# Patient Record
Sex: Female | Born: 1935 | Race: White | Hispanic: No
Health system: Midwestern US, Community
[De-identification: ages and names within clinical notes are randomized; demographics above are authoritative.]

## PROBLEM LIST (undated history)

## (undated) DIAGNOSIS — R609 Edema, unspecified: Secondary | ICD-10-CM

## (undated) DIAGNOSIS — Z5189 Encounter for other specified aftercare: Secondary | ICD-10-CM

## (undated) DIAGNOSIS — I10 Essential (primary) hypertension: Secondary | ICD-10-CM

## (undated) DIAGNOSIS — M1990 Unspecified osteoarthritis, unspecified site: Secondary | ICD-10-CM

## (undated) DIAGNOSIS — R319 Hematuria, unspecified: Secondary | ICD-10-CM

## (undated) DIAGNOSIS — I6523 Occlusion and stenosis of bilateral carotid arteries: Secondary | ICD-10-CM

## (undated) HISTORY — PX: TONSILLECTOMY: SUR1361

## (undated) HISTORY — PX: HAMMER TOE SURGERY: SHX385

## (undated) HISTORY — PX: CHOLECYSTECTOMY: SHX55

## (undated) HISTORY — PX: HERNIA REPAIR: SHX51

---

## 2006-06-17 ENCOUNTER — Encounter: Admission: RE | Admit: 2006-06-17 | Discharge: 2006-06-17 | Payer: Self-pay | Admitting: Orthopedic Surgery

## 2007-04-09 IMAGING — CR DG MYELOGRAM LUMBAR
3 series · 3 of 3 positions shown · IV contrast (omnipaque)
Comparison: none

CLINICAL DATA: Low back pain radiating to the right hip. 
 LUMBAR MYELOGRAM:
TECHNIQUE: Lumbar puncture was performed from a left-sided approach to the midline at the L5-S1 interlaminar space using a 22 gauge spinal needle.  18 cc of Omnipaque 180 were instilled.
TECHNIQUE: Multidetector CT imaging of the lumbar spine was performed after intrathecal injection of contrast.  Multiplanar CT image reconstructions were also generated.

[view not recorded (1 of 3)]
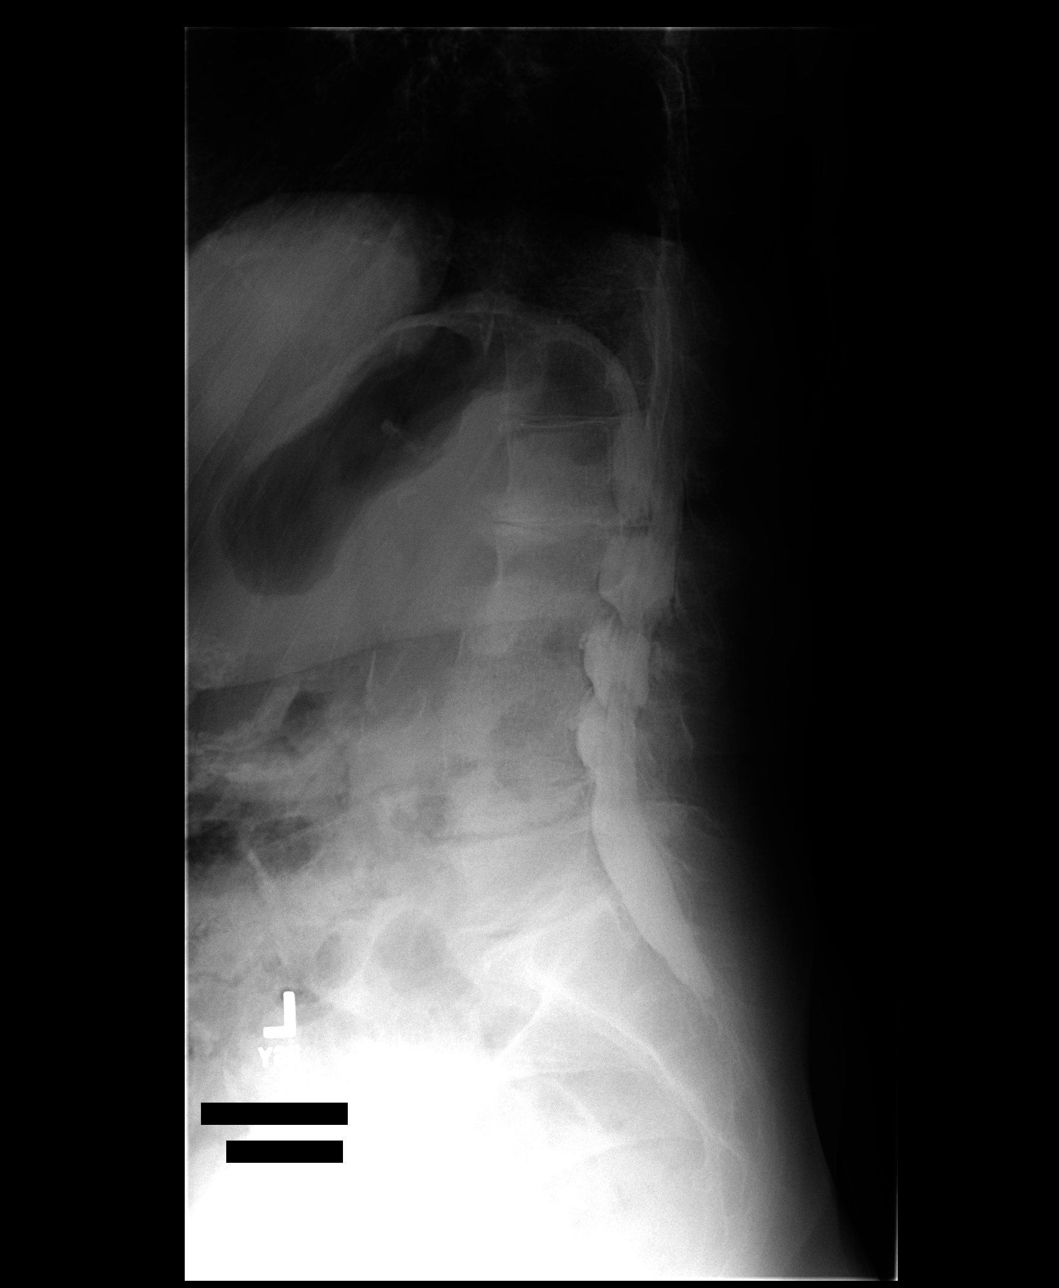

[view not recorded (2 of 3)]
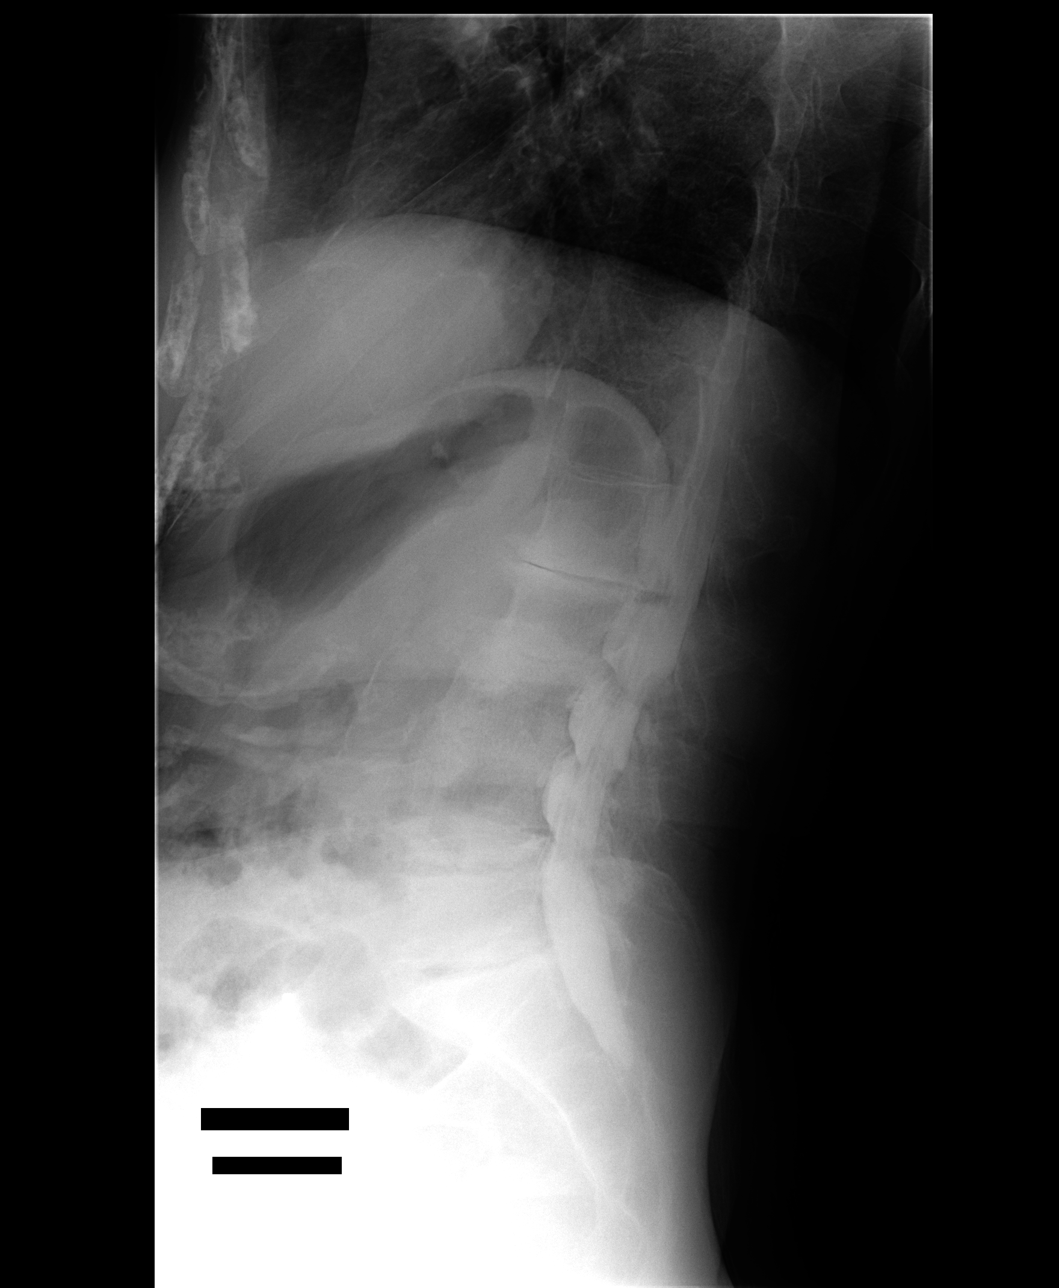

[view not recorded (3 of 3)]
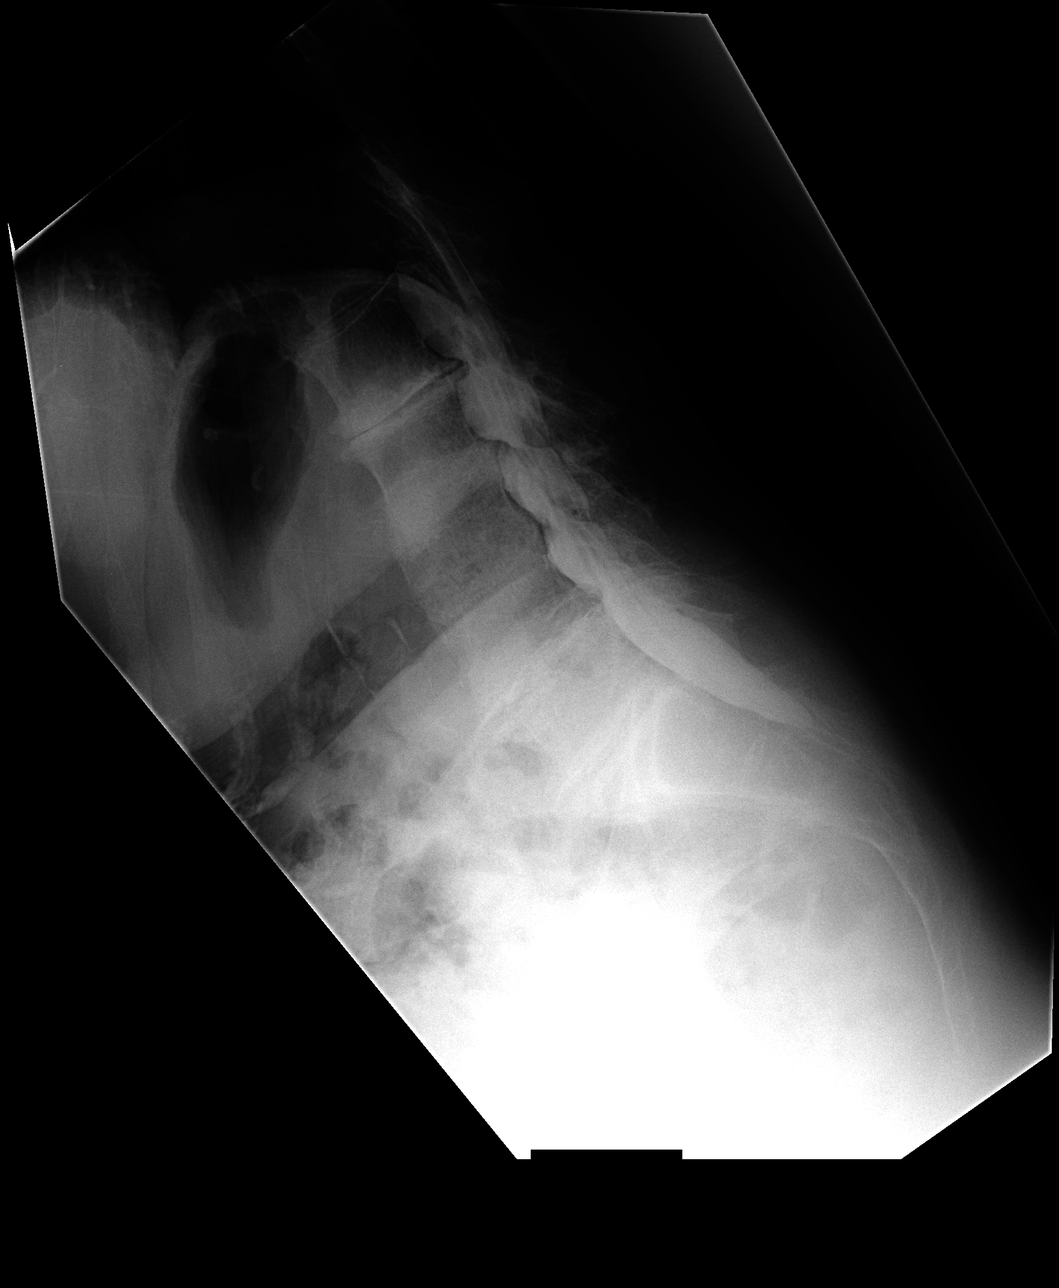

[3 of 3 positions shown; findings below may reference images not displayed]

FINDINGS: There is thoracolumbar scoliosis convex to the right and lower lumbar scoliosis convex to the left.  The discs are degenerated throughout the lumbar region with loss of height more towards the left at T12-L1, L1-2, and L2-3, uniformly at L3-4 and more towards the right at L4-5.  No compressive stenosis is seen at the T12-L1 level.  At L1-2, there is mild narrowing of the left lateral recess.  There is potential for foraminal narrowing on the left. 
 At L2-3, there is an anterior extradural defect with narrowing of the lateral recesses and potential for neural foraminal narrowing on the left. 
 At L3-4, there is an anterior extradural defect with bilateral lateral recess narrowing.
 At L4-5, there is an anterior extradural defect with narrowing of the lateral recesses, more on the right.  There is potential for foraminal narrowing on the right.
 At L5-S1, the disc is degenerated but there is no apparent affect upon the thecal sac or S-1 nerve roots.
 Standing lateral flexion and extension views show the patient does not generate a great degree of bending motion, but no abnormal subluxation occurs.
IMPRESSION: Scoliosis and multilevel degenerative disease.  Lateral recess and foraminal narrowing on the left at L1-2 and L2-3, bilaterally at L3-4 and on the right at L4-5.  See results of CT scan below. 
 CT LUMBAR SPINE WITH CONTRAST (POST-MYELOGRAM):
FINDINGS: L1-2:  The disc is degenerated with loss of height.  There are end plate osteophytes that indent the thecal sac and narrow the lateral recesses mildly.  There is mild foraminal encroachment by osteophytes on the left but neural compression is not suspected at this level.
 At L2-3:  The disc is degenerated with loss of height.  There are end plate osteophytes, more pronounced towards the left.  There is facet hypertrophy of a mild degree bilaterally.  There is narrowing of both lateral recesses and of the intervertebral foramen on the left.  
 L3-4:  The disc is degenerated with loss of height.  There are end plate osteophytes that indent the thecal sac.  There is facet arthropathy, more on the right than the left.  The lateral recesses are mildly narrowed bilaterally.  There is foraminal narrowing on the right that could affect the L-3 nerve root.  In addition, the right-sided facet could be symptomatic in and of itself.
 L4-5:  The disc is markedly degenerated with loss of height.  There are end plate osteophytes more pronounced towards the right.  There is facet arthropathy, right more than left.  There is stenosis of the right lateral recess.  There is foraminal stenosis on the right that could well compress the L-4 nerve root.  The facet on the right that could well compress the L-4 nerve root.  The facet on the right could be symptomatic in and of itself.  
 L5-S1:  The disc is degenerated with loss of height.  There are end plate osteophytes and bulging disc material.  The central canal and S-1 nerve roots are unaffected.  There is neural foraminal narrowing on the right that could irritate the exiting L-5 nerve root though distinct compression is not established.
IMPRESSION: 1.  Scoliosis and multilevel degenerative disease. 
 2.  At L1-2 and L2-3, there is some narrowing of the lateral recesses and of the foramina on the left but no distinct neural compression demonstrated.  No significant right-sided pathology is seen at those levels. 
 3.  At L3-4, there is narrowing of both lateral recesses and some osteophytic encroachment upon both neural foramina.  Although definite neural compression is not established, neural irritation could occur at this level.  Additionally, facet arthropathy on the right could be symptomatic in and of itself.
 4.  At L4-5, the disease is more pronounced on the right with right lateral recess narrowing due to end plate osteophytes, protruding disc material, and marked facet arthropathy on the right.  There is potential for neural compression in the right lateral recess and in the intervertebral foramen on the right.  Additionally, the right-sided facet could be symptomatic in and of itself. 
 5.  At L5-S1, there is degenerative disc disease and degenerative facet disease.  There is mild neural foraminal narrowing on the right but no distinct neural compression.  The facet degeneration on the right could be symptomatic in and of itself.

## 2007-04-09 IMAGING — CT CT L SPINE W/ CM
3 of 9 series · 6 of 20 positions shown, 7 images · IV contrast (omnipaque)
Comparison: none

CLINICAL DATA: Low back pain radiating to the right hip. 
 LUMBAR MYELOGRAM:
TECHNIQUE: Lumbar puncture was performed from a left-sided approach to the midline at the L5-S1 interlaminar space using a 22 gauge spinal needle.  18 cc of Omnipaque 180 were instilled.
TECHNIQUE: Multidetector CT imaging of the lumbar spine was performed after intrathecal injection of contrast.  Multiplanar CT image reconstructions were also generated.

[Series 2: l-spine helical · axial · 0.27mm/px · z∈[-70,-20]mm · 2 of 62 slices shown]
[im 21/62  bone]
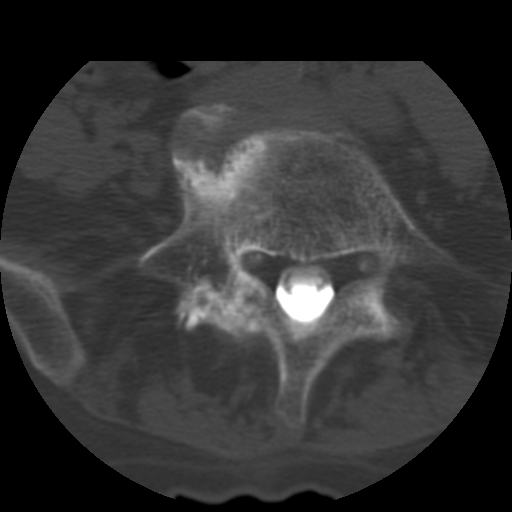
[im 41/62  bone]
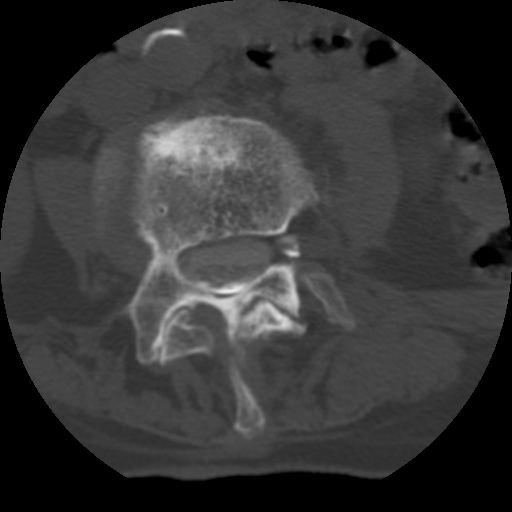

[Series 3: bone windows · axial · 0.27mm/px · z∈[-82,-7]mm · 3 of 62 slices shown, 4 images]
[im 16/62  soft-tissue]
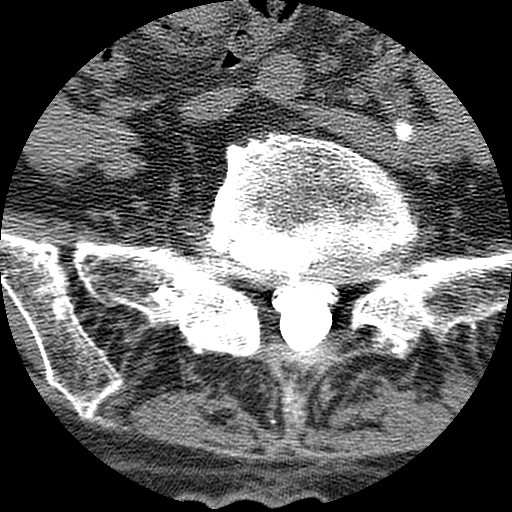
[im 16/62  bone]
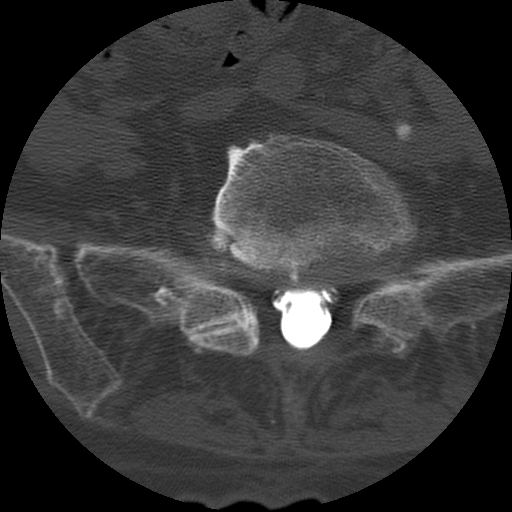
[im 31/62  bone]
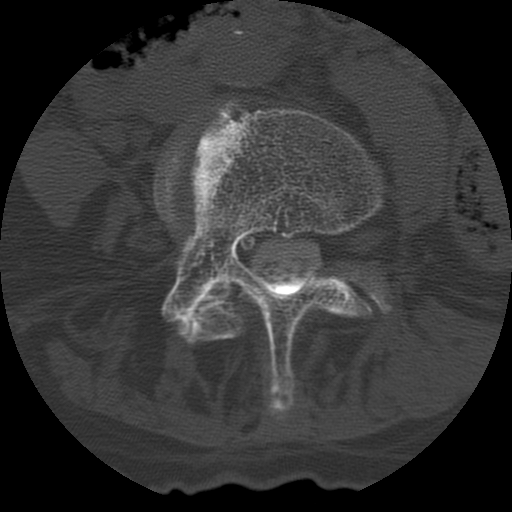
[im 46/62  bone]
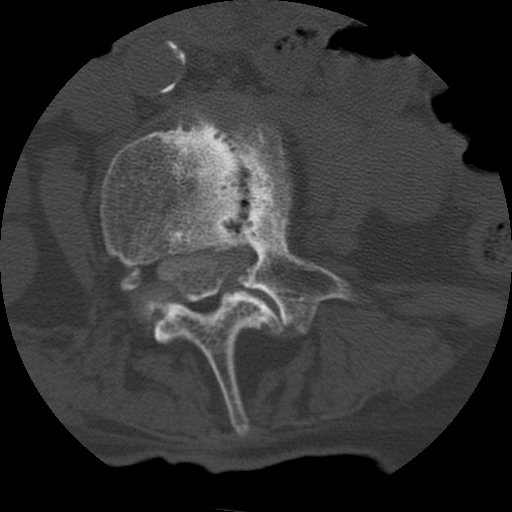

[Series 400: sagittal · sagittal · 0.31mm/px · 1 of 40 slices shown]
[im 20/40  bone]
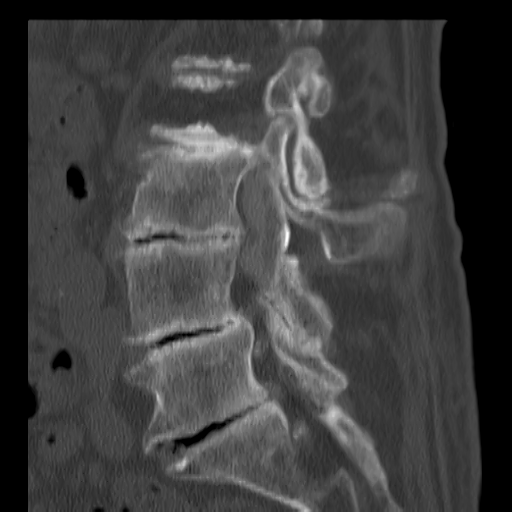

[6 of 20 positions shown; findings below may reference images not displayed]

FINDINGS: There is thoracolumbar scoliosis convex to the right and lower lumbar scoliosis convex to the left.  The discs are degenerated throughout the lumbar region with loss of height more towards the left at T12-L1, L1-2, and L2-3, uniformly at L3-4 and more towards the right at L4-5.  No compressive stenosis is seen at the T12-L1 level.  At L1-2, there is mild narrowing of the left lateral recess.  There is potential for foraminal narrowing on the left. 
 At L2-3, there is an anterior extradural defect with narrowing of the lateral recesses and potential for neural foraminal narrowing on the left. 
 At L3-4, there is an anterior extradural defect with bilateral lateral recess narrowing.
 At L4-5, there is an anterior extradural defect with narrowing of the lateral recesses, more on the right.  There is potential for foraminal narrowing on the right.
 At L5-S1, the disc is degenerated but there is no apparent affect upon the thecal sac or S-1 nerve roots.
 Standing lateral flexion and extension views show the patient does not generate a great degree of bending motion, but no abnormal subluxation occurs.
IMPRESSION: Scoliosis and multilevel degenerative disease.  Lateral recess and foraminal narrowing on the left at L1-2 and L2-3, bilaterally at L3-4 and on the right at L4-5.  See results of CT scan below. 
 CT LUMBAR SPINE WITH CONTRAST (POST-MYELOGRAM):
FINDINGS: L1-2:  The disc is degenerated with loss of height.  There are end plate osteophytes that indent the thecal sac and narrow the lateral recesses mildly.  There is mild foraminal encroachment by osteophytes on the left but neural compression is not suspected at this level.
 At L2-3:  The disc is degenerated with loss of height.  There are end plate osteophytes, more pronounced towards the left.  There is facet hypertrophy of a mild degree bilaterally.  There is narrowing of both lateral recesses and of the intervertebral foramen on the left.  
 L3-4:  The disc is degenerated with loss of height.  There are end plate osteophytes that indent the thecal sac.  There is facet arthropathy, more on the right than the left.  The lateral recesses are mildly narrowed bilaterally.  There is foraminal narrowing on the right that could affect the L-3 nerve root.  In addition, the right-sided facet could be symptomatic in and of itself.
 L4-5:  The disc is markedly degenerated with loss of height.  There are end plate osteophytes more pronounced towards the right.  There is facet arthropathy, right more than left.  There is stenosis of the right lateral recess.  There is foraminal stenosis on the right that could well compress the L-4 nerve root.  The facet on the right that could well compress the L-4 nerve root.  The facet on the right could be symptomatic in and of itself.  
 L5-S1:  The disc is degenerated with loss of height.  There are end plate osteophytes and bulging disc material.  The central canal and S-1 nerve roots are unaffected.  There is neural foraminal narrowing on the right that could irritate the exiting L-5 nerve root though distinct compression is not established.
IMPRESSION: 1.  Scoliosis and multilevel degenerative disease. 
 2.  At L1-2 and L2-3, there is some narrowing of the lateral recesses and of the foramina on the left but no distinct neural compression demonstrated.  No significant right-sided pathology is seen at those levels. 
 3.  At L3-4, there is narrowing of both lateral recesses and some osteophytic encroachment upon both neural foramina.  Although definite neural compression is not established, neural irritation could occur at this level.  Additionally, facet arthropathy on the right could be symptomatic in and of itself.
 4.  At L4-5, the disease is more pronounced on the right with right lateral recess narrowing due to end plate osteophytes, protruding disc material, and marked facet arthropathy on the right.  There is potential for neural compression in the right lateral recess and in the intervertebral foramen on the right.  Additionally, the right-sided facet could be symptomatic in and of itself. 
 5.  At L5-S1, there is degenerative disc disease and degenerative facet disease.  There is mild neural foraminal narrowing on the right but no distinct neural compression.  The facet degeneration on the right could be symptomatic in and of itself.

## 2013-09-26 ENCOUNTER — Emergency Department (HOSPITAL_COMMUNITY)
Admission: EM | Admit: 2013-09-26 | Discharge: 2013-09-26 | Disposition: A | Discharge status: Home / Self Care | Payer: Medicare Other | Attending: Emergency Medicine | Admitting: Emergency Medicine

## 2013-09-26 ENCOUNTER — Encounter (HOSPITAL_COMMUNITY): Payer: Self-pay | Admitting: Emergency Medicine

## 2013-09-26 DIAGNOSIS — M1990 Unspecified osteoarthritis, unspecified site: Secondary | ICD-10-CM | POA: Insufficient documentation

## 2013-09-26 DIAGNOSIS — R197 Diarrhea, unspecified: Secondary | ICD-10-CM | POA: Insufficient documentation

## 2013-09-26 DIAGNOSIS — R630 Anorexia: Secondary | ICD-10-CM | POA: Insufficient documentation

## 2013-09-26 DIAGNOSIS — R110 Nausea: Secondary | ICD-10-CM

## 2013-09-26 DIAGNOSIS — I10 Essential (primary) hypertension: Secondary | ICD-10-CM | POA: Insufficient documentation

## 2013-09-26 DIAGNOSIS — Z79899 Other long term (current) drug therapy: Secondary | ICD-10-CM | POA: Insufficient documentation

## 2013-09-26 DIAGNOSIS — R000 Tachycardia, unspecified: Secondary | ICD-10-CM | POA: Insufficient documentation

## 2013-09-26 DIAGNOSIS — Z87891 Personal history of nicotine dependence: Secondary | ICD-10-CM | POA: Insufficient documentation

## 2013-09-26 HISTORY — DX: Unspecified osteoarthritis, unspecified site: M19.90

## 2013-09-26 HISTORY — DX: Encounter for other specified aftercare: Z51.89

## 2013-09-26 HISTORY — DX: Essential (primary) hypertension: I10

## 2013-09-26 LAB — COMPREHENSIVE METABOLIC PANEL
ALK PHOS: 58 U/L (ref 39–117)
ALT: 11 U/L (ref 0–35)
AST: 19 U/L (ref 0–37)
Albumin: 3.8 g/dL (ref 3.5–5.2)
BUN: 11 mg/dL (ref 6–23)
CO2: 25 meq/L (ref 19–32)
Calcium: 8.5 mg/dL (ref 8.4–10.5)
Chloride: 97 mEq/L (ref 96–112)
Creatinine, Ser: 0.62 mg/dL (ref 0.50–1.10)
GFR, EST NON AFRICAN AMERICAN: 80 mL/min — AB (ref >90)
GLUCOSE: 94 mg/dL (ref 70–99)
POTASSIUM: 3.5 meq/L — AB (ref 3.7–5.3)
SODIUM: 136 meq/L — AB (ref 137–147)
TOTAL PROTEIN: 6.9 g/dL (ref 6.0–8.3)
Total Bilirubin: 0.7 mg/dL (ref 0.3–1.2)

## 2013-09-26 LAB — URINALYSIS, ROUTINE W REFLEX MICROSCOPIC
BILIRUBIN URINE: NEGATIVE
Glucose, UA: NEGATIVE mg/dL
KETONES UR: 15 mg/dL — AB
NITRITE: NEGATIVE
PH: 7 (ref 5.0–8.0)
Protein, ur: NEGATIVE mg/dL
SPECIFIC GRAVITY, URINE: 1.014 (ref 1.005–1.030)
Urobilinogen, UA: 1 mg/dL (ref 0.0–1.0)

## 2013-09-26 LAB — CBC WITH DIFFERENTIAL/PLATELET
Basophils Absolute: 0 10*3/uL (ref 0.0–0.1)
Basophils Relative: 0 % (ref 0–1)
EOS ABS: 0 10*3/uL (ref 0.0–0.7)
Eosinophils Relative: 0 % (ref 0–5)
HCT: 29.8 % — ABNORMAL LOW (ref 36.0–46.0)
Hemoglobin: 10.1 g/dL — ABNORMAL LOW (ref 12.0–15.0)
LYMPHS ABS: 1.2 10*3/uL (ref 0.7–4.0)
LYMPHS PCT: 30 % (ref 12–46)
MCH: 27.8 pg (ref 26.0–34.0)
MCHC: 33.9 g/dL (ref 30.0–36.0)
MCV: 82.1 fL (ref 78.0–100.0)
Monocytes Absolute: 0.3 10*3/uL (ref 0.1–1.0)
Monocytes Relative: 8 % (ref 3–12)
NEUTROS ABS: 2.5 10*3/uL (ref 1.7–7.7)
NEUTROS PCT: 62 % (ref 43–77)
PLATELETS: 78 10*3/uL — AB (ref 150–400)
RBC: 3.63 MIL/uL — AB (ref 3.87–5.11)
RDW: 15.5 % (ref 11.5–15.5)
WBC: 4 10*3/uL (ref 4.0–10.5)

## 2013-09-26 LAB — URINE MICROSCOPIC-ADD ON

## 2013-09-26 MED ORDER — SODIUM CHLORIDE 0.9 % IV BOLUS (SEPSIS)
1000.0000 mL | Freq: Once | INTRAVENOUS | Status: AC
  Administered 2013-09-26: 1000 mL via INTRAVENOUS

## 2013-09-26 NOTE — ED Provider Notes (Signed)
CSN: [id number]     Arrival date & time 09/26/13  1111 History   First MD Initiated Contact with Patient 09/26/13 1504     Chief Complaint  Patient presents with  . Diarrhea  . Nausea     (Consider location/radiation/quality/duration/timing/severity/associated sxs/prior Treatment) HPI Patient presents with concerns of ongoing nausea, diarrhea, anorexia. Symptoms have been present for almost one year. Over this time the patient has had multiple evaluations by multiple physicians.  She has had cholecystectomy. She has had endoscopy. Over the past day his symptoms have been more severe, with increased nausea, both in general and postprandial. She's had multiple episodes of diarrhea, though no vomiting. No bloody stool. No lightheadedness, syncope, chest pain, dyspnea, abdominal pain. When asked why she presents today she replies that she had too much to do yesterday.  Past Medical History  Diagnosis Date  . Arthritis   . Osteoarthritis   . Blood transfusion without reported diagnosis   . Hypertension    Past Surgical History  Procedure Laterality Date  . Cholecystectomy    . Hernia repair    . Hammer toe surgery    . Tonsillectomy     No family history on file. History  Substance Use Topics  . Smoking status: Former Smoker    Quit date: 09/27/1963  . Smokeless tobacco: Not on file  . Alcohol Use: No   OB History   Grav Para Term Preterm Abortions TAB SAB Ect Mult Living                 Review of Systems  Constitutional:       Per HPI, otherwise negative  HENT:       Per HPI, otherwise negative  Respiratory:       Per HPI, otherwise negative  Cardiovascular:       Per HPI, otherwise negative  Gastrointestinal: Positive for nausea and diarrhea. Negative for vomiting and abdominal pain.  Endocrine:       Negative aside from HPI  Genitourinary:       Neg aside from HPI   Musculoskeletal:       Per HPI, otherwise negative  Skin: Negative.   Neurological:  Negative for syncope.      Allergies  Review of patient's allergies indicates no known allergies.  Home Medications   Current Outpatient Rx  Name  Route  Sig  Dispense  Refill  . denosumab (PROLIA) 60 MG/ML SOLN injection   Subcutaneous   Inject 60 mg into the skin every 6 (six) months. Administer in upper arm, thigh, or abdomen         . furosemide (LASIX) 40 MG tablet   Oral   Take 40 mg by mouth daily.         Marland Kitchen. oxyCODONE (ROXICODONE) 15 MG immediate release tablet   Oral   Take 15 mg by mouth every 6 (six) hours as needed for pain.         . polyethylene glycol (MIRALAX / GLYCOLAX) packet   Oral   Take 17 g by mouth daily as needed for mild constipation.         Marland Kitchen. trimethoprim (TRIMPEX) 100 MG tablet   Oral   Take 100 mg by mouth 3 (three) times a week. Monday Wednesday and friday         . verapamil (CALAN-SR) 120 MG CR tablet   Oral   Take 120 mg by mouth daily.         .Marland Kitchen  Vitamin D, Ergocalciferol, (DRISDOL) 50000 UNITS CAPS capsule   Oral   Take 50,000 Units by mouth every 7 (seven) days. Fridays          BP 147/56  Pulse 78  Temp(Src) 98.3 F (36.8 C) (Oral)  Resp 18  SpO2 100% Physical Exam  Nursing note and vitals reviewed. Constitutional: She is oriented to person, place, and time. She appears well-developed. No distress.  Thin elderly F in NAD  HENT:  Head: Normocephalic and atraumatic.  Eyes: Conjunctivae and EOM are normal.  Cardiovascular: Regular rhythm.  Tachycardia present.   Pulmonary/Chest: Effort normal and breath sounds normal. No stridor. No respiratory distress.  Abdominal: Normal appearance and bowel sounds are normal. She exhibits no distension. There is no tenderness. There is no rigidity and no guarding.  Musculoskeletal: She exhibits no edema.  Neurological: She is alert and oriented to person, place, and time. No cranial nerve deficit.  Skin: Skin is warm and dry. She is not diaphoretic.  Psychiatric: She has a  normal mood and affect.    ED Course  Procedures (including critical care time) Labs Review Labs Reviewed  URINALYSIS, ROUTINE W REFLEX MICROSCOPIC - Abnormal; Notable for the following:    Hgb urine dipstick SMALL (*)    Ketones, ur 15 (*)    Leukocytes, UA TRACE (*)    All other components within normal limits  CBC WITH DIFFERENTIAL - Abnormal; Notable for the following:    RBC 3.63 (*)    Hemoglobin 10.1 (*)    HCT 29.8 (*)    Platelets 78 (*)    All other components within normal limits  COMPREHENSIVE METABOLIC PANEL - Abnormal; Notable for the following:    Sodium 136 (*)    Potassium 3.5 (*)    GFR calc non Af Amer 80 (*)    All other components within normal limits  URINE MICROSCOPIC-ADD ON    5:26 PM On repeat exams.  Vital signs normal. She and her husband who are aware of all results and the need followup with gastroenterology as an outpatient.   MDM   Final diagnoses:  Nausea  Diarrhea    Patient presents with concerns of ongoing nausea, anorexia, diarrhea. On exam she is awake, alert and hemodynamically stable aside from mild tachycardia. Patient's labs are largely reassuring.  After 1 L of fluid for resuscitation the patient's vital signs are normalized, and she remained hemodynamically stable, in no distress throughout her emergency department course.     Gerhard Munch, MD 09/27/13 [phone]

## 2013-09-26 NOTE — ED Notes (Addendum)
Pt states she's had N/D since Saturday.  Pt states that her diarrhea is black at times.  Pt states that last June she had the same symptoms and had to have her "bile ducts cleaned out".  [facility]  Pt states she feels the same as she did then.  Pt denies pain currently, but states she has had pain from the diarrhea.

## 2013-09-26 NOTE — Discharge Instructions (Signed)
As discussed, your evaluation today has been largely reassuring.  But, it is important that you monitor your condition carefully, and do not hesitate to return to the ED if you develop new, or concerning changes in your condition.  Otherwise, please follow-up with our gastroenterologist for appropriate ongoing care.    Chronic Diarrhea Diarrhea is frequent loose and watery bowel movements. It can cause you to feel weak and dehydrated. Dehydration can cause you to become tired and thirsty and to have a dry mouth, decreased urination, and dark yellow urine. Diarrhea is a sign of another problem, most often an infection that will not last long. In most cases, diarrhea lasts 2 3 days. Diarrhea that lasts longer than 4 weeks is called long-lasting (chronic) diarrhea. It is important to treat your diarrhea as directed by your health care provider to lessen or prevent future episodes of diarrhea.  CAUSES  There are many causes of chronic diarrhea. The following are some possible causes:   Gastrointestinal infections caused by viruses, bacteria, or parasites.   Food poisoning or food allergies.   Certain medicines, such as antibiotics, chemotherapy, and laxatives.   Artificial sweeteners and fructose.   Digestive disorders, such as celiac disease and inflammatory bowel diseases.   Irritable bowel syndrome.  Some disorders of the pancreas.  Disorders of the thyroid.  Reduced blood flow to the intestines.  Cancer. Sometimes the cause of chronic diarrhea is unknown. RISK FACTORS  Having a severely weakened immune system, such as from HIV or AIDS.   Taking certain types of cancer-fighting drugs (such as with chemotherapy) or other medicines.   Having had a recent organ transplant.   Having a portion of the stomach or small bowel removed.   Traveling to countries where food and water supplies are often contaminated.  SYMPTOMS  In addition to frequent, loose stools, diarrhea  may cause:   Cramping.   Abdominal pain.   Nausea.   Fever.  Fatigue.  Urgent need to use the bathroom.  Loss of bowel control. DIAGNOSIS  Your health care provider must take a careful history and perform a physical exam. Tests given are based on your symptoms and history. Tests may include:   Blood or stool tests. Three or more stool samples may be examined. Stool cultures may be used to test for bacteria or parasites.   X-rays.   A procedure in which a thin tube is inserted into the mouth or rectum (endoscopy). This allows the health care provider to look inside the intestine.  TREATMENT   Treatment is aimed at correcting the cause of the diarrhea when possible.  Diarrhea caused by an infection can often be treated with antibiotics.   Diarrhea not caused by an infection may require you to take long-term medicine or have surgery. Specific treatment should be discussed with your health care provider.   If the cause cannot be determined, treatment aims to relieve symptoms and prevent dehydration. Serious health problems can occur if you do not maintain proper fluid levels. Treatment may include:   Taking an oral rehydration solution (ORS) .  Not drinking beverages that contain caffeine (such as tea, coffee, and soft drinks).   Not drinking alcohol.   Maintaining well-balanced nutrition to help you recover faster. HOME CARE INSTRUCTIONS   Drink enough fluids to keep urine clear or pale yellow. Drink 1 cup (8 oz) of fluid for each diarrhea episode. Avoid fluids that contain simple sugars, fruit juices, whole milk products, and sodas. Hydrate with an  ORS. You may purchase the ORS or prepare it at home by mixing the following ingredients together:     tsp (1.7 3  mL) table salt.    tsp (3  mL) baking soda.    tsp (1.7 mL) salt substitute containing potassium chloride.   1 tbsp (20 mL) sugar.   4.2 c (1 L) of water.   Certain foods and beverages may  increase the speed at which food moves through the gastrointestinal (GI) tract. These foods and beverages should be avoided. They include:   Caffeinated and alcoholic beverages.   High-fiber foods, such as raw fruits and vegetables, nuts, seeds, and whole grain breads and cereals.   Foods and beverages sweetened with sugar alcohols, such as xylitol, sorbitol, and mannitol.   Some foods may be well tolerated and may help thicken stool. These include:  Starchy foods, such as rice, toast, pasta, low-sugar cereal, oatmeal, grits, baked potatoes, crackers, and bagels.   Bananas.   Applesauce.   Add probiotic-rich foods to help increase healthy bacteria in the GI tract. These include yogurt and fermented milk products.   Wash your hands well after each diarrhea episode.   Only take over-the-counter or prescription medicines as directed by your health care provider.   Take a warm bath to relieve any burning or pain from frequent diarrhea episodes. SEEK MEDICAL CARE IF:   You are not urinating as often.  Your urine is a dark color.   You become very tired or dizzy.   You have severe pain in the abdomen or rectum.   Your have blood or pus in your stools.   Your stools look black and tarry.  SEEK IMMEDIATE MEDICAL CARE IF:   You are unable to keep fluids down.   You have persistent vomiting.   You have blood in your stool.  Your stools are black and tarry.   You do not urinate in 6 8 hours, or there is only a small amount of very dark urine.   You have abdominal pain that increases or localizes.   You have weakness, dizziness, confusion, or lightheadedness.   You have a severe headache.   Your diarrhea gets worse or does not get better.   You have a fever or persistent symptoms for more than 2 3 days.   You have a fever and your symptoms suddenly get worse.  MAKE SURE YOU:   Understand these instructions.  Will watch your  condition.  Will get help right away if you are not doing well or get worse. Document Released: 09/18/2003 Document Revised: 02/28/2013 Document Reviewed: 12/21/2012 [facility] Patient Information 2014 [employer] [place].

## 2016-06-17 ENCOUNTER — Ambulatory Visit: Admit: 2016-06-17 | Discharge: 2016-06-17 | Payer: MEDICARE | Attending: Urology | Primary: Family

## 2016-06-17 DIAGNOSIS — N390 Urinary tract infection, site not specified: Secondary | ICD-10-CM

## 2016-06-17 LAB — AMB POC URINALYSIS DIP STICK AUTO W/ MICRO (MICRO RESULTS)
Bilirubin (UA POC): NEGATIVE
Epithelial cells (UA POC): 0
Glucose (UA POC): NEGATIVE
Ketones (UA POC): NEGATIVE
Nitrites (UA POC): NEGATIVE
Protein (UA POC): NEGATIVE
RBCs (UA POC): 0
Specific gravity (UA POC): 1.015 (ref 1.001–1.035)
Urobilinogen (UA POC): 1 (ref 0.2–1)
pH (UA POC): 6.5 (ref 4.6–8.0)

## 2016-06-17 MED ORDER — NITROFURANTOIN MACROCRYSTAL 50 MG CAP
50 mg | ORAL_CAPSULE | Freq: Every evening | ORAL | 3 refills | Status: DC
Start: 2016-06-17 — End: 2018-01-13

## 2016-06-17 NOTE — Progress Notes (Signed)
ASSESSMENT:   Encounter Diagnoses     ICD-10-CM ICD-9-CM   1. Recurrent UTI N39.0 599.0     1. Recurrent UTI - Recurrent UTIs are probably multifactorial and stem from both host and bacterial factors:    - Poor bladder drainage with significant debris in the bladder likely allowing for colonization with bacteria, possibly 2/2 long term Methadone use.    - Atrophy of the vaginal/introital mucosa from lack of estrogenization post-menopausally    - Impaired immunity related to chronic medical conditions and age  We reviewed the AUA guidelines for recurrent UTIs as acquiring 2 culture positive infections in 6 months or 3 infections in 1 year. Stressed the importance of obtaining UCx when she presents with infectious symptoms and to only take abx upon receiving culture results. Discussed recurrent vs persistent UTIs and potential causes, including failure to eradicate bacteria vs reinfection. Will try to 1) treat any current infection 2) prevent future infections and 3) determine any potentially correctable causes for infections (via imaging).    - Cath specimen sent for culture, will call with results.    - Will begin low dose Macrodantin 50mg  QHS, Rx provided. Advised to d/c Trimethoprim.    - Begin Vaginal Estrogen once weekly, application reviewed.    - Discussed Cranberry supplements, literature provided.    - RUS & KUB to further assess upper tracts and r/o possible obstruction and/or stones.    - She may also benefit from estrogen cream to the introitus to improve vaginal tissue health, will discuss at follow up in several months.     2. Body mass index is 15.66 kg/(m^2).    - BMI is within the normal parameters.     RTC next available cysto with RUS & KUB prior; sooner if needed.   All questions answered.         HISTORY OF PRESENT ILLNESS:  Jennifer Holder is a 82 y.o. female who presents today for evaluation of recurrent UTI, accompanied by daughter, Jennifer Holder.    Patient is doing well today, states she has struggled with recurrent UTI's for many years. Previously followed by a Physician, ?urologist, when living in [place], [place]. Patient appears to be a poor historian and is unable to recall any details of care. Does state she has taken Trimethoprim TIW as prophylaxis in the past. UTI associated symptoms include dysuria, frequency, urgency and gross hematuria. Symptoms have resolved in the past with a course of abx. The patient states she has some baseline urinary frequency and urgency, however this is not overly bothersome. Denies passing air in urine stream and no evident fecal matter. Denies flank pain, gross hematuria, dysuria and is asymptomatic for infection today. No f/c/n/v.     No chemical or radiation exposure.   No hx of Breast or Cervical cancer.   Medicated daily with Methadone TID for hx of Scoliosis.   Very brief history of smoking, 1960's.  No personal hx o kidney stones. Denies pneumaturia/fecaluria/hx of diverticulititis. Not usin Steroids. Non diabetic.    FH of Stroke, Mother.      REVIEW OF SYSTEMS:     Constitutional: Fever: No  Skin: Rash: No  HEENT: Hearing difficulty: No  Eyes: Blurred vision: No  Cardiovascular: Chest pain: No  Respiratory: Shortness of breath: No  Gastrointestinal: Nausea/vomiting: No  Musculoskeletal: Back pain: No  Neurological: Weakness: No  Psychological: Memory loss: No  Comments/additional findings:     Past Medical History:   Diagnosis Date   ??? Scoliosis  Past Surgical History:   Procedure Laterality Date   ??? HX CHOLECYSTECTOMY     ??? HX HERNIA REPAIR  2007     History reviewed. No pertinent family history.    Social History   Substance Use Topics   ??? Smoking status: Former Smoker     Years: 10.00   ??? Smokeless tobacco: Never Used   ??? Alcohol use Yes      Comment: coasionally     No Known Allergies    Current Outpatient Prescriptions   Medication Sig Dispense Refill    ??? furosemide (LASIX) 40 mg tablet Take  by mouth daily.     ??? verapamil (CALAN) 120 mg tablet Take 120 mg by mouth three (3) times daily.     ??? methadone (DOLOPHINE) 10 mg tablet Take  by mouth every four (4) hours as needed for Pain.     ??? nitrofurantoin (MACRODANTIN) 50 mg capsule Take 1 Cap by mouth nightly. 30 Cap 3     OB History     No data available          PHYSICAL EXAMINATION:     Visit Vitals   ??? BP 118/60   ??? Ht 5\' 7"  (1.702 m)   ??? Wt 100 lb (45.4 kg)   ??? BMI 15.66 kg/m2     Constitutional: Well developed, well-nourished female in no acute distress.   CV:  No peripheral swelling noted  Respiratory: No respiratory distress or difficulties  Abdomen:  Soft and nontender. No masses.    GU Female 06/17/16:     Vagina: moderate atrophy, significant prolapse   Urethra: small caruncle, no prolapse   Cervix and uterus nl    Rectocele: none     Urethral hypermobility is absent   Negative leak with cough/strain.    Skin: No bruising or rashes.  No petechia.    Neuro/Psych:  Patient with appropriate affect.  Alert and oriented.    Lymphatic:   No enlargement of inguinal lymph nodes.    REVIEW OF LABS AND IMAGING:      Results for orders placed or performed in visit on 06/17/16   AMB POC URINALYSIS DIP STICK AUTO W/ MICRO (MICRO RESULTS)   Result Value Ref Range    Color (UA POC) Yellow     Clarity (UA POC) Clear     Glucose (UA POC) Negative Negative    Bilirubin (UA POC) Negative Negative    Ketones (UA POC) Negative Negative    Specific gravity (UA POC) 1.015 1.001 - 1.035    Blood (UA POC) 1+ Negative    pH (UA POC) 6.5 4.6 - 8.0    Protein (UA POC) Negative Negative    Urobilinogen (UA POC) 1 mg/dL 0.2 - 1    Nitrites (UA POC) Negative Negative    Leukocyte esterase (UA POC) 1+ Negative    Epithelial cells (UA POC) 0     WBCs (UA POC) 0-3     RBCs (UA POC) 0      Bacteria (UA POC)  Negative    Crystals (UA POC)  Negative    Other (UA POC)         Prior labs or imaging:    Ucx    04/12/16: 25,000 - 50,000 cfu/mL Enterobacter aerogenes  01/26/16:  >100,000 CFU/mL Enterobacter cloacae ??  12/11/15: >100,000 CFU/mL Klebsiella pneumoniae     A copy of today's office visit with all pertinent imaging results and labs were sent to the  referring physician.      Janann August, MD  [address]  [place], [place]   [address]  [phone] ext [phone]  [phone] fax    Medical Documentation is provided with the assistance of Vassie Loll, Medical Scribe for Janann August, MD

## 2016-06-21 LAB — URINE C&S

## 2016-07-01 ENCOUNTER — Ambulatory Visit: Admit: 2016-07-01 | Discharge: 2016-07-01 | Payer: MEDICARE | Primary: Family

## 2016-07-01 DIAGNOSIS — N390 Urinary tract infection, site not specified: Secondary | ICD-10-CM

## 2016-07-01 NOTE — Progress Notes (Signed)
Renal ultrasound done per office protocol.

## 2016-07-03 NOTE — Progress Notes (Signed)
Please call to tell her that the renal US suggests that she has stones, so she will need a non contrast CT of abdomen and pelvis. Can you order that study? Thanks.  res

## 2016-07-06 ENCOUNTER — Encounter

## 2016-07-06 NOTE — Telephone Encounter (Signed)
Janann Augustoger E Schultz, MD  [facility] ??   ??    ??    ??   ?? Please call to tell her that the renal US suggests that she has stones, so she will need a non contrast CT of abdomen and pelvis. Can you order that study? Thanks.   res       Called pt and informed, [facility].

## 2016-08-11 ENCOUNTER — Encounter: Attending: Urology | Primary: Family

## 2016-08-11 ENCOUNTER — Ambulatory Visit: Admit: 2016-08-11 | Discharge: 2016-08-12 | Payer: MEDICARE | Primary: Family

## 2016-08-11 DIAGNOSIS — N390 Urinary tract infection, site not specified: Secondary | ICD-10-CM

## 2016-08-11 NOTE — Progress Notes (Signed)
KUB performed today per Dr. Schultz.

## 2016-08-23 NOTE — Progress Notes (Unsigned)
I contacted this patient to try to get her into the office to have the Cysto scheduled, but the patient declined to set an appointment. She says she wants to hold off until all of the results of the testing is back before she schedules. Patient is scheduled to have a Ct of the Abdomin  And Pelvis on Feb 13 at 1:00pm.

## 2016-08-25 ENCOUNTER — Encounter

## 2016-08-30 NOTE — Progress Notes (Signed)
Please call her to review the CT report and tell her that she must contact either her PCP or GI specialist to decide if the biliary dilation needs to be addressed.  Thanks.  res

## 2016-08-31 NOTE — Telephone Encounter (Signed)
Jennifer Augustoger E Schultz, MD  [facility] ??   ??    ??    ??   ?? Please call her to review the CT report and tell her that she must contact either her PCP or GI specialist to decide if the biliary dilation needs to be addressed. ??Thanks.   res       Called pt and informed of results. CT report has been forwarded to PCP. Advised pt to call them and make an appointment, pt agreed and verbalized understanding.  [facility] and informed the results had been routed.

## 2018-01-11 ENCOUNTER — Emergency Department: Admit: 2018-01-11 | Payer: MEDICARE | Primary: Family

## 2018-01-11 ENCOUNTER — Inpatient Hospital Stay
Admit: 2018-01-11 | Discharge: 2018-01-13 | Disposition: A | Discharge status: Home / Self Care | Payer: MEDICARE | Attending: Internal Medicine | Admitting: Internal Medicine

## 2018-01-11 DIAGNOSIS — I214 Non-ST elevation (NSTEMI) myocardial infarction: Secondary | ICD-10-CM

## 2018-01-11 LAB — URINE MICROSCOPIC ONLY
RBC, UA: 4 /hpf (ref 0–5)
RBC: 4 /hpf (ref 0–5)
WBC, UA: 0 /hpf (ref 0–4)
WBC: 0 /hpf (ref 0–4)

## 2018-01-11 LAB — EKG, 12 LEAD, SUBSEQUENT
Atrial Rate: 88 {beats}/min
Calculated P Axis: 35 degrees
Calculated R Axis: -26 degrees
Calculated T Axis: 43 degrees
P-R Interval: 212 ms
Q-T Interval: 464 ms
QRS Duration: 78 ms
QTC Calculation (Bezet): 561 ms
Ventricular Rate: 88 {beats}/min

## 2018-01-11 LAB — URINALYSIS W/ RFLX MICROSCOPIC
Bilirubin, Urine: NEGATIVE
Bilirubin: NEGATIVE
Glucose, Ur: NEGATIVE mg/dL
Glucose: NEGATIVE mg/dL
Ketone: NEGATIVE mg/dL
Ketones, Urine: NEGATIVE mg/dL
Leukocyte Esterase, Urine: NEGATIVE
Leukocyte Esterase: NEGATIVE
Nitrite, Urine: NEGATIVE
Nitrites: NEGATIVE
Protein, UA: NEGATIVE mg/dL
Protein: NEGATIVE mg/dL
Specific Gravity, UA: 1.009 (ref 1.005–1.030)
Specific gravity: 1.009 (ref 1.005–1.030)
Urobilinogen, UA, POCT: 1 EU/dL (ref 0.2–1.0)
Urobilinogen: 1 EU/dL (ref 0.2–1.0)
pH (UA): 8.5 — ABNORMAL HIGH (ref 5.0–8.0)
pH, UA: 8.5 — ABNORMAL HIGH (ref 5.0–8.0)

## 2018-01-11 LAB — HEPATIC FUNCTION PANEL
A-G Ratio: 1.2 (ref 0.8–1.7)
ALT (SGPT): 19 U/L (ref 13–56)
ALT: 19 U/L (ref 13–56)
AST (SGOT): 37 U/L (ref 15–37)
AST: 37 U/L (ref 15–37)
Albumin/Globulin Ratio: 1.2 (ref 0.8–1.7)
Albumin: 4 g/dL (ref 3.4–5.0)
Albumin: 4 g/dL (ref 3.4–5.0)
Alk. phosphatase: 71 U/L (ref 45–117)
Alkaline Phosphatase: 71 U/L (ref 45–117)
Bilirubin, Direct: 0.2 MG/DL (ref 0.0–0.2)
Bilirubin, direct: 0.2 MG/DL (ref 0.0–0.2)
Bilirubin, total: 1 MG/DL (ref 0.2–1.0)
Globulin: 3.4 g/dL (ref 2.0–4.0)
Globulin: 3.4 g/dL (ref 2.0–4.0)
Protein, total: 7.4 g/dL (ref 6.4–8.2)
Total Bilirubin: 1 MG/DL (ref 0.2–1.0)
Total Protein: 7.4 g/dL (ref 6.4–8.2)

## 2018-01-11 LAB — NT-PRO BNP: NT pro-BNP: 2693 PG/ML — ABNORMAL HIGH (ref 0–1800)

## 2018-01-11 LAB — METABOLIC PANEL, BASIC
Anion gap: 5 mmol/L (ref 3.0–18)
BUN/Creatinine ratio: 18 (ref 12–20)
BUN: 17 MG/DL (ref 7.0–18)
CO2: 33 mmol/L — ABNORMAL HIGH (ref 21–32)
Calcium: 9.2 MG/DL (ref 8.5–10.1)
Chloride: 100 mmol/L (ref 100–108)
Creatinine: 0.92 MG/DL (ref 0.6–1.3)
GFR est AA: >60 mL/min/{1.73_m2} (ref >60)
GFR est non-AA: 57 mL/min/{1.73_m2} — ABNORMAL LOW (ref >60)
Glucose: 118 mg/dL — ABNORMAL HIGH (ref 74–99)
Potassium: 3.8 mmol/L (ref 3.5–5.5)
Sodium: 138 mmol/L (ref 136–145)

## 2018-01-11 LAB — CBC WITH AUTOMATED DIFF
ABS. BASOPHILS: 0 10*3/uL (ref 0.0–0.1)
ABS. EOSINOPHILS: 0 10*3/uL (ref 0.0–0.4)
ABS. LYMPHOCYTES: 2.6 10*3/uL (ref 0.8–3.5)
ABS. MONOCYTES: 0.6 10*3/uL (ref 0–1.0)
ABS. NEUTROPHILS: 12.8 10*3/uL — ABNORMAL HIGH (ref 1.8–8.0)
BASOPHILS: 0 % (ref 0–3)
EOSINOPHILS: 0 % (ref 0–5)
HCT: 33.6 % — ABNORMAL LOW (ref 35.0–45.0)
HGB: 11.2 g/dL — ABNORMAL LOW (ref 12.0–16.0)
LYMPHOCYTES: 16 % — ABNORMAL LOW (ref 20–51)
MCH: 28.5 PG (ref 24.0–34.0)
MCHC: 33.3 g/dL (ref 31.0–37.0)
MCV: 85.5 FL (ref 74.0–97.0)
MONOCYTES: 4 % (ref 2–9)
NEUTROPHILS: 80 % — ABNORMAL HIGH (ref 42–75)
PLATELET: 113 10*3/uL — ABNORMAL LOW (ref 135–420)
RBC: 3.93 M/uL — ABNORMAL LOW (ref 4.20–5.30)
RDW: 15.5 % — ABNORMAL HIGH (ref 11.6–14.5)
WBC: 16 10*3/uL — ABNORMAL HIGH (ref 4.6–13.2)

## 2018-01-11 LAB — CARDIAC PANEL,(CK, CKMB & TROPONIN)
CK - MB: 9.4 ng/ml — ABNORMAL HIGH (ref <3.6)
CK-MB Index: 6.9 % — ABNORMAL HIGH (ref 0.0–4.0)
CK: 136 U/L (ref 26–192)
Troponin-I, QT: 3.01 NG/ML — CR (ref 0.0–0.045)

## 2018-01-11 LAB — EKG, 12 LEAD, INITIAL
Atrial Rate: 99 {beats}/min
Calculated P Axis: 65 degrees
Calculated R Axis: -16 degrees
Calculated T Axis: 52 degrees
P-R Interval: 194 ms
Q-T Interval: 408 ms
QRS Duration: 84 ms
QTC Calculation (Bezet): 523 ms
Ventricular Rate: 99 {beats}/min

## 2018-01-11 LAB — POC LACTIC ACID
Lactic Acid (POC): 2.13 mmol/L — CR (ref 0.40–2.00)
Lactic Acid (POC): 1 mmol/L (ref 0.40–2.00)

## 2018-01-11 LAB — TROPONIN I: Troponin-I, QT: 3.04 NG/ML — CR (ref 0.0–0.045)

## 2018-01-11 LAB — MAGNESIUM
Magnesium: 2.6 mg/dL (ref 1.6–2.6)
Magnesium: 2.6 mg/dL (ref 1.6–2.6)

## 2018-01-11 LAB — BASIC METABOLIC PANEL
Anion Gap: 5 mmol/L (ref 3.0–18)
BUN: 17 MG/DL (ref 7.0–18)
Bun/Cre Ratio: 18 (ref 12–20)
CO2: 33 mmol/L — ABNORMAL HIGH (ref 21–32)
Calcium: 9.2 MG/DL (ref 8.5–10.1)
Chloride: 100 mmol/L (ref 100–108)
Creatinine: 0.92 MG/DL (ref 0.6–1.3)
EGFR IF NonAfrican American: 57 mL/min/{1.73_m2} — ABNORMAL LOW (ref >60)
GFR African American: >60 mL/min/{1.73_m2} (ref >60)
Glucose: 118 mg/dL — ABNORMAL HIGH (ref 74–99)
Potassium: 3.8 mmol/L (ref 3.5–5.5)
Sodium: 138 mmol/L (ref 136–145)

## 2018-01-11 LAB — CBC WITH AUTO DIFFERENTIAL
Basophils %: 0 % (ref 0–3)
Basophils Absolute: 0 10*3/uL (ref 0.0–0.1)
Eosinophils %: 0 % (ref 0–5)
Eosinophils Absolute: 0 10*3/uL (ref 0.0–0.4)
Hematocrit: 33.6 % — ABNORMAL LOW (ref 35.0–45.0)
Hemoglobin: 11.2 g/dL — ABNORMAL LOW (ref 12.0–16.0)
Lymphocytes %: 16 % — ABNORMAL LOW (ref 20–51)
Lymphocytes Absolute: 2.6 10*3/uL (ref 0.8–3.5)
MCH: 28.5 PG (ref 24.0–34.0)
MCHC: 33.3 g/dL (ref 31.0–37.0)
MCV: 85.5 FL (ref 74.0–97.0)
Monocytes %: 4 % (ref 2–9)
Monocytes Absolute: 0.6 10*3/uL (ref 0–1.0)
Neutrophils %: 80 % — ABNORMAL HIGH (ref 42–75)
Neutrophils Absolute: 12.8 10*3/uL — ABNORMAL HIGH (ref 1.8–8.0)
Platelets: 113 10*3/uL — ABNORMAL LOW (ref 135–420)
RBC: 3.93 M/uL — ABNORMAL LOW (ref 4.20–5.30)
RDW: 15.5 % — ABNORMAL HIGH (ref 11.6–14.5)
WBC: 16 10*3/uL — ABNORMAL HIGH (ref 4.6–13.2)

## 2018-01-11 LAB — EKG 12-LEAD
Atrial Rate: 88 {beats}/min
Atrial Rate: 99 {beats}/min
P Axis: 35 degrees
P Axis: 65 degrees
P-R Interval: 194 ms
P-R Interval: 212 ms
Q-T Interval: 408 ms
Q-T Interval: 464 ms
QRS Duration: 78 ms
QRS Duration: 84 ms
QTc Calculation (Bazett): 523 ms
QTc Calculation (Bazett): 561 ms
R Axis: -16 degrees
R Axis: -26 degrees
T Axis: 43 degrees
T Axis: 52 degrees
Ventricular Rate: 88 {beats}/min
Ventricular Rate: 99 {beats}/min

## 2018-01-11 LAB — CARDIAC PANEL
CK-MB Index: 6.9 % — ABNORMAL HIGH (ref 0.0–4.0)
CK-MB: 9.4 ng/ml — ABNORMAL HIGH (ref <3.6)
Total CK: 136 U/L (ref 26–192)
Troponin I: 3.01 NG/ML (ref 0.0–0.045)

## 2018-01-11 LAB — POCT LACTIC ACID
POC Lactic Acid: 2.13 mmol/L (ref 0.40–2.00)
POC Lactic Acid: 1 mmol/L (ref 0.40–2.00)

## 2018-01-11 LAB — PROBNP, N-TERMINAL: BNP: 2693 PG/ML — ABNORMAL HIGH (ref 0–1800)

## 2018-01-11 LAB — TROPONIN: Troponin I: 3.04 NG/ML (ref 0.0–0.045)

## 2018-01-11 MED ORDER — ATORVASTATIN 40 MG TAB
40 mg | Freq: Every evening | ORAL | Status: DC
Start: 2018-01-11 — End: 2018-01-13
  Administered 2018-01-12 – 2018-01-13 (×2): via ORAL

## 2018-01-11 MED ORDER — SODIUM CHLORIDE 0.9 % IJ SYRG
INTRAMUSCULAR | Status: DC | PRN
Start: 2018-01-11 — End: 2018-01-13

## 2018-01-11 MED ORDER — SODIUM CHLORIDE 0.9 % IV
INTRAVENOUS | Status: DC
Start: 2018-01-11 — End: 2018-01-12
  Administered 2018-01-11: 20:00:00 via INTRAVENOUS

## 2018-01-11 MED ORDER — ASPIRIN 81 MG CHEWABLE TAB
81 mg | Freq: Every day | ORAL | Status: DC
Start: 2018-01-11 — End: 2018-01-13
  Administered 2018-01-12 – 2018-01-13 (×2): via ORAL

## 2018-01-11 MED ORDER — MINERAL OIL ORAL
99.5 % | Freq: Once | ORAL | Status: AC
Start: 2018-01-11 — End: 2018-01-11
  Administered 2018-01-11: 23:00:00 via RECTAL

## 2018-01-11 MED ORDER — BISACODYL 10 MG RECTAL SUPPOSITORY
10 mg | Freq: Every day | RECTAL | Status: DC | PRN
Start: 2018-01-11 — End: 2018-01-13

## 2018-01-11 MED ORDER — ASPIRIN 325 MG TAB
325 mg | ORAL | Status: AC
Start: 2018-01-11 — End: 2018-01-11
  Administered 2018-01-11: 17:00:00 via ORAL

## 2018-01-11 MED ORDER — SODIUM CHLORIDE 0.9% BOLUS IV
0.9 % | Freq: Once | INTRAVENOUS | Status: AC
Start: 2018-01-11 — End: 2018-01-11
  Administered 2018-01-11: 14:00:00 via INTRAVENOUS

## 2018-01-11 MED ORDER — POLYETHYLENE GLYCOL 3350 17 GRAM (100 %) ORAL POWDER PACKET
17 gram | Freq: Every day | ORAL | Status: DC
Start: 2018-01-11 — End: 2018-01-13
  Administered 2018-01-12 – 2018-01-13 (×2): via ORAL

## 2018-01-11 MED ORDER — ONDANSETRON (PF) 4 MG/2 ML INJECTION
4 mg/2 mL | INTRAMUSCULAR | Status: DC | PRN
Start: 2018-01-11 — End: 2018-01-13

## 2018-01-11 MED ORDER — ENOXAPARIN 60 MG/0.6 ML SUB-Q SYRINGE
60 mg/0.6 mL | Freq: Two times a day (BID) | SUBCUTANEOUS | Status: DC
Start: 2018-01-11 — End: 2018-01-12
  Administered 2018-01-11 – 2018-01-12 (×2): via SUBCUTANEOUS

## 2018-01-11 MED ORDER — VERAPAMIL 80 MG TAB
80 mg | Freq: Three times a day (TID) | ORAL | Status: DC
Start: 2018-01-11 — End: 2018-01-12
  Administered 2018-01-11 – 2018-01-12 (×2): via ORAL

## 2018-01-11 MED ORDER — ACETAMINOPHEN 325 MG TABLET
325 mg | ORAL | Status: DC | PRN
Start: 2018-01-11 — End: 2018-01-13
  Administered 2018-01-12: 02:00:00 via ORAL

## 2018-01-11 MED ORDER — PIPERACILLIN-TAZOBACTAM 3.375 GRAM IV SOLR
3.375 gram | Freq: Four times a day (QID) | INTRAVENOUS | Status: DC
Start: 2018-01-11 — End: 2018-01-11
  Administered 2018-01-11: 14:00:00 via INTRAVENOUS

## 2018-01-11 MED ORDER — IOPAMIDOL 61 % IV SOLN
300 mg iodine /mL (61 %) | Freq: Once | INTRAVENOUS | Status: AC
Start: 2018-01-11 — End: 2018-01-11
  Administered 2018-01-11: 15:00:00 via INTRAVENOUS

## 2018-01-11 MED ORDER — SENNOSIDES-DOCUSATE SODIUM 8.6 MG-50 MG TAB
Freq: Every day | ORAL | Status: DC
Start: 2018-01-11 — End: 2018-01-13
  Administered 2018-01-12 – 2018-01-13 (×2): via ORAL

## 2018-01-11 MED FILL — ISOVUE-300  61 % INTRAVENOUS SOLUTION: 300 mg iodine /mL (61 %) | INTRAVENOUS | Qty: 100

## 2018-01-11 MED FILL — VERAPAMIL 80 MG TAB: 80 mg | ORAL | Qty: 2

## 2018-01-11 MED FILL — ASPIRIN 325 MG TAB: 325 mg | ORAL | Qty: 1

## 2018-01-11 MED FILL — SODIUM CHLORIDE 0.9 % IV: INTRAVENOUS | Qty: 1000

## 2018-01-11 MED FILL — BD POSIFLUSH NORMAL SALINE 0.9 % INJECTION SYRINGE: INTRAMUSCULAR | Qty: 10

## 2018-01-11 MED FILL — GLYCERIN 99.5 % TOPICAL SOLN: 99.5 % | CUTANEOUS | Qty: 125

## 2018-01-11 MED FILL — ZOSYN 3.375 GRAM INTRAVENOUS SOLUTION: 3.375 gram | INTRAVENOUS | Qty: 3.38

## 2018-01-11 MED FILL — SODIUM CHLORIDE 0.9 % IV: INTRAVENOUS | Qty: 600

## 2018-01-11 MED FILL — LOVENOX 60 MG/0.6 ML SUBCUTANEOUS SYRINGE: 60 mg/0.6 mL | SUBCUTANEOUS | Qty: 0.6

## 2018-01-11 NOTE — ED Provider Notes (Signed)
EMERGENCY DEPARTMENT HISTORY AND PHYSICAL EXAM      Date: 01/11/2018  Patient Name: Jennifer Holder    History of Presenting Illness     Chief Complaint   Patient presents with   ??? Diarrhea   ??? Fatigue   ??? Abdominal Pain       History Provided By: Patient      HPI/Chief Complaint: (Context):who presents with chief complaint of abdominal pain 1 day, fatigue and generalized weakness  Patient's daughter is at bedside state patient is usually does her own independent living every day and has her own apartment next to them but today she was not answering phone call and she was very fatigued and weak and has had constipation she took some mag citrate yesterday and had some loose bowel movement today otherwise there is no recent antibiotic no other pains that she is complained of no black or bloody stool  No fever no chills no chest pain or shortness of breath no back pain no focal deficits that are present right now.  Patient does have scoliosis and chronic back pain and there is no acute exacerbation of the symptoms there is no bedsores  Patient does have abdominal pain that is constant mild to moderate on history.  No radiation symptoms  Moderate in nature  Worse with palpation and movement  No alleviating factors  ----------  -------------  Patient's triage note is reviewed at 9:43 AM, patient's vitals are stable with tachycardia 104 blood pressure stable 137/66, pulse ox is 99% on room air  Patient has stool on the gown  No allergies  Home medication include Lasix methadone nitrofurantoin in the past  Past medical history of scoliosis  Surgical history of hernia repair in 2007 and cholecystectomy  Alcohol uses occasionally, former smoker  Patient's prior visits are reviewed and patient does not have significant ED visits except as outpatient visits     PCP: Whitehurst-Doss, Collie Siad, NP    Current Facility-Administered Medications   Medication Dose Route Frequency Provider Last Rate Last Dose    ??? sodium chloride (NS) flush 5-10 mL  5-10 mL IntraVENous PRN Nyoka Lint, MD       ??? verapamil (CALAN) tablet 120 mg  120 mg Oral TID Nyoka Lint, MD       ??? 0.9% sodium chloride infusion  50 mL/hr IntraVENous CONTINUOUS Nyoka Lint, MD       ??? enoxaparin (LOVENOX) injection 50 mg  1 mg/kg SubCUTAneous Q12H Nyoka Lint, MD       ??? [START ON 01/12/2018] aspirin chewable tablet 81 mg  81 mg Oral DAILY Nyoka Lint, MD       ??? atorvastatin (LIPITOR) tablet 80 mg  80 mg Oral QHS Nyoka Lint, MD       ??? [START ON 01/12/2018] polyethylene glycol (MIRALAX) packet 17 g  17 g Oral DAILY Nyoka Lint, MD       ??? [START ON 01/12/2018] senna-docusate (PERICOLACE) 8.6-50 mg per tablet 1 Tab  1 Tab Oral DAILY Nyoka Lint, MD       ??? bisacodyl (DULCOLAX) suppository 10 mg  10 mg Rectal DAILY PRN Nyoka Lint, MD       ??? acetaminophen (TYLENOL) tablet 650 mg  650 mg Oral Q4H PRN Nyoka Lint, MD       ??? ondansetron [facility]) injection 4 mg  4 mg IntraVENous Q4H PRN Nyoka Lint, MD       ???  hogg enema  500 mL Rectal ONCE Nyoka Lint, MD           Past History     Past Medical History:  Past Medical History:   Diagnosis Date   ??? Scoliosis        Past Surgical History:  Past Surgical History:   Procedure Laterality Date   ??? HX CHOLECYSTECTOMY     ??? HX HERNIA REPAIR  2007       Family History:  No family history on file.    Social History:  Social History     Tobacco Use   ??? Smoking status: Former Smoker     Years: 10.00   ??? Smokeless tobacco: Never Used   Substance Use Topics   ??? Alcohol use: Yes     Comment: coasionally   ??? Drug use: No       Allergies:  No Known Allergies      Review of Systems   Review of Systems   Constitutional: Positive for fatigue. Negative for activity change and fever.   HENT: Negative for congestion and rhinorrhea.    Eyes: Negative for visual disturbance.   Respiratory: Negative for shortness of breath.    Cardiovascular: Negative for chest pain and palpitations.    Gastrointestinal: Positive for abdominal pain. Negative for diarrhea, nausea and vomiting.   Genitourinary: Negative for dysuria and hematuria.   Musculoskeletal: Negative for back pain.   Skin: Negative for rash.   Neurological: Negative for dizziness, weakness and light-headedness.   Psychiatric/Behavioral: Negative for agitation.   All other systems reviewed and are negative.      Physical Exam     Physical Exam   Constitutional: She appears well-developed and well-nourished. No distress.   HENT:   Head: Normocephalic and atraumatic.   Right Ear: External ear normal.   Left Ear: External ear normal.   Nose: Nose normal.   Mouth/Throat: Oropharynx is clear and moist.   Eyes: Pupils are equal, round, and reactive to light. Conjunctivae and EOM are normal. No scleral icterus.   Neck: Normal range of motion. Neck supple. No JVD present. No tracheal deviation present. No thyromegaly present.   Cardiovascular: Normal rate and regular rhythm. Exam reveals no friction rub.   No murmur heard.  Pulmonary/Chest: Effort normal and breath sounds normal. No stridor. She exhibits no tenderness.   Abdominal: Soft. Bowel sounds are normal. She exhibits no distension. There is tenderness. There is no rebound and no guarding.   No rigidity no rebound positive bowel sounds.  Mild distention is appreciated  No pulsatile mass no hernia that can be appreciated.   Musculoskeletal: Normal range of motion. She exhibits no edema or tenderness.   Lymphadenopathy:     She has no cervical adenopathy.   Neurological: She is alert. No cranial nerve deficit. Coordination normal.   Skin: Skin is warm and dry.   Psychiatric: She has a normal mood and affect. Her behavior is normal. Judgment and thought content normal.   Nursing note and vitals reviewed.      Medical Decision Making   I am the first provider for this patient.    I reviewed the vital signs, available nursing notes, past medical history,  past surgical history, family history and social history.      Provider Notes (Medical Decision Making): Patient with abdominal pain and generalized weakness  Concern for obstruction versus UTI versus intra-abdominal pathology  I will check patient's lab also include cardiac markers as  patient is fatigued patient's EKG is nonspecific does not show any sign of acute ischemia or infarction  I will check chest x-ray for pneumonia as well patient's white count of 16,000 I am starting sepsis protocol for this patient in the ED as unclear of etiology I will get CT scan abdomen as well    Vital Signs-Reviewed the patient's vital signs.    Pulse Oximetry Analysis -97%, normal, room air    Cardiac Monitor:  Rate/Rhythm: 97, sinus rhythm    EKG:  Interpreted by the EP.Time of the EKG is 9:47 AM, 99, sinus rhythm, QTC is widened to 523, slight leftward axis appreciated  There is no sign of acute STEMI or dysrhythmia.       Vitals:    01/11/18 1330 01/11/18 1345 01/11/18 1350 01/11/18 1505   BP: '90/56 99/56 99/56 ' 106/66   Pulse: 91 89 90 82   Resp: '14 14 17 18   ' Temp:   98.4 ??F (36.9 ??C) 97.9 ??F (36.6 ??C)   SpO2: 96% 97% 98% 96%   Weight:       Height:           Records Reviewed: Nursing Notes    ED Course:   Information communicated with patient in detail and the daughter.  Patient remained stable in the emergency department patient is awaiting a bed with the hospitalist group and on the floor.  I discussed all the information regarding elevated troponin as well and need for followed by cardiology will see her in the hospital as well repeat EKG did not show any abnormality      For Hospitalized Patients:    1. Hospitalization Decision Time:  The decision to hospitalize the patient was made by Dr. Suzanne Boron PM    2. Aspirin: Aspirin was given  ----  CRITICAL CARE NOTE :    4:17 PM      IMPENDING DETERIORATION -Cardiovascular    ASSOCIATED RISK FACTORS - Dysrhythmia    MANAGEMENT- Bedside Assessment    INTERPRETATION -  ECG     INTERVENTIONS - hemodynamic mngmt    CASE REVIEW - Hospitalist    TREATMENT RESPONSE -Improved    PERFORMED BY - Self        NOTES   :      I have spent 30 minutes of critical care time involved in lab review, consultations with specialist, family decision- making, bedside attention and documentation. During this entire length of time I was immediately available to the patient .    Meredith Pel, MD      Diagnostic Study Results     Labs -     Recent Results (from the past 12 hour(s))   CBC WITH AUTOMATED DIFF    Collection Time: 01/11/18  8:27 AM   Result Value Ref Range    WBC 16.0 (H) 4.6 - 13.2 K/uL    RBC 3.93 (L) 4.20 - 5.30 M/uL    HGB 11.2 (L) 12.0 - 16.0 g/dL    HCT 33.6 (L) 35.0 - 45.0 %    MCV 85.5 74.0 - 97.0 FL    MCH 28.5 24.0 - 34.0 PG    MCHC 33.3 31.0 - 37.0 g/dL    RDW 15.5 (H) 11.6 - 14.5 %    PLATELET 113 (L) 135 - 420 K/uL    NEUTROPHILS 80 (H) 42 - 75 %    LYMPHOCYTES 16 (L) 20 - 51 %    MONOCYTES 4 2 - 9 %  EOSINOPHILS 0 0 - 5 %    BASOPHILS 0 0 - 3 %    ABS. NEUTROPHILS 12.8 (H) 1.8 - 8.0 K/UL    ABS. LYMPHOCYTES 2.6 0.8 - 3.5 K/UL    ABS. MONOCYTES 0.6 0 - 1.0 K/UL    ABS. EOSINOPHILS 0.0 0.0 - 0.4 K/UL    ABS. BASOPHILS 0.0 0.0 - 0.1 K/UL    PLATELET COMMENTS LARGE PLATELETS      RBC COMMENTS ANISOCYTOSIS  1+        RBC COMMENTS POIKILOCYTOSIS  1+        RBC COMMENTS SCHISTOCYTES  1+        DF MANUAL     METABOLIC PANEL, BASIC    Collection Time: 01/11/18  8:27 AM   Result Value Ref Range    Sodium 138 136 - 145 mmol/L    Potassium 3.8 3.5 - 5.5 mmol/L    Chloride 100 100 - 108 mmol/L    CO2 33 (H) 21 - 32 mmol/L    Anion gap 5 3.0 - 18 mmol/L    Glucose 118 (H) 74 - 99 mg/dL    BUN 17 7.0 - 18 MG/DL    Creatinine 0.92 0.6 - 1.3 MG/DL    BUN/Creatinine ratio 18 12 - 20      GFR est AA >60 >60 ml/min/1.66m    GFR est non-AA 57 (L) >60 ml/min/1.776m   Calcium 9.2 8.5 - 10.1 MG/DL   CARDIAC PANEL,(CK, CKMB & TROPONIN)    Collection Time: 01/11/18  8:27 AM   Result Value Ref Range     CK 136 26 - 192 U/L    CK - MB 9.4 (H) <3.6 ng/ml    CK-MB Index 6.9 (H) 0.0 - 4.0 %    Troponin-I, QT 3.01 (HH) 0.0 - 0.045 NG/ML   HEPATIC FUNCTION PANEL    Collection Time: 01/11/18  8:27 AM   Result Value Ref Range    Protein, total 7.4 6.4 - 8.2 g/dL    Albumin 4.0 3.4 - 5.0 g/dL    Globulin 3.4 2.0 - 4.0 g/dL    A-G Ratio 1.2 0.8 - 1.7      Bilirubin, total 1.0 0.2 - 1.0 MG/DL    Bilirubin, direct 0.2 0.0 - 0.2 MG/DL    Alk. phosphatase 71 45 - 117 U/L    AST (SGOT) 37 15 - 37 U/L    ALT (SGPT) 19 13 - 56 U/L   NT-PRO BNP    Collection Time: 01/11/18  8:27 AM   Result Value Ref Range    NT pro-BNP 2,693 (H) 0 - 1,800 PG/ML   URINALYSIS W/ RFLX MICROSCOPIC    Collection Time: 01/11/18  9:33 AM   Result Value Ref Range    Color YELLOW      Appearance CLEAR      Specific gravity 1.009 1.005 - 1.030      pH (UA) 8.5 (H) 5.0 - 8.0      Protein NEGATIVE  NEG mg/dL    Glucose NEGATIVE  NEG mg/dL    Ketone NEGATIVE  NEG mg/dL    Bilirubin NEGATIVE  NEG      Blood SMALL (A) NEG      Urobilinogen 1.0 0.2 - 1.0 EU/dL    Nitrites NEGATIVE  NEG      Leukocyte Esterase NEGATIVE  NEG     URINE MICROSCOPIC ONLY    Collection Time: 01/11/18  9:33 AM   Result  Value Ref Range    WBC 0 to 1 0 - 4 /hpf    RBC 4 to 10 0 - 5 /hpf    Epithelial cells FEW 0 - 5 /lpf    Bacteria 1+ (A) NEG /hpf    Amorphous Crystals 1+ (A) NEG   EKG, 12 LEAD, INITIAL    Collection Time: 01/11/18  9:47 AM   Result Value Ref Range    Ventricular Rate 99 BPM    Atrial Rate 99 BPM    P-R Interval 194 ms    QRS Duration 84 ms    Q-T Interval 408 ms    QTC Calculation (Bezet) 523 ms    Calculated P Axis 65 degrees    Calculated R Axis -16 degrees    Calculated T Axis 52 degrees    Diagnosis       Sinus rhythm with occasional premature ventricular complexes with 1st degree   AV block  Nonspecific ST and T wave abnormality  Septal infarct , age undetermined Possible  Prolonged QT  Abnormal ECG  No previous ECGs available   Confirmed by Ailene Rud ([id number]) on 01/11/2018 11:15:42 AM     POC LACTIC ACID    Collection Time: 01/11/18  9:53 AM   Result Value Ref Range    Lactic Acid (POC) 2.13 (HH) 0.40 - 2.00 mmol/L   EKG, 12 LEAD, SUBSEQUENT    Collection Time: 01/11/18 10:56 AM   Result Value Ref Range    Ventricular Rate 88 BPM    Atrial Rate 88 BPM    P-R Interval 212 ms    QRS Duration 78 ms    Q-T Interval 464 ms    QTC Calculation (Bezet) 561 ms    Calculated P Axis 35 degrees    Calculated R Axis -26 degrees    Calculated T Axis 43 degrees    Diagnosis       Sinus rhythm with 1st degree AV block  Low voltage QRS  Septal infarct (cited on or before 11-Jan-2018)  Prolonged QT  Abnormal ECG  When compared with ECG of 11-Jan-2018 09:47,  premature ventricular complexes are no longer present  Confirmed by Ailene Rud ([id number]) on 01/11/2018 11:16:22 AM     POC LACTIC ACID    Collection Time: 01/11/18  1:56 PM   Result Value Ref Range    Lactic Acid (POC) 1.00 0.40 - 2.00 mmol/L       Radiologic Studies -   CT ABD PELV W CONT   Final Result   IMPRESSION:      1.  Marked burden of formed stool throughout the large intestine with imaging   findings suggesting fecal impaction.   2. No bowel obstruction.   3. Intrahepatic and extrahepatic biliary ductal dilatation greater than   anticipated for a postcholecystectomy state. Correlation with liver function   tests recommended, with MRCP or ERCP as appropriate clinically. Notably, similar   findings were described on outside hospital abdominal/pelvic CT report 08/24/2016      XR CHEST PORT   Final Result   IMPRESSION:      No active cardiopulmonary disease.        CT Results  (Last 48 hours)               01/11/18 1119  CT ABD PELV W CONT Final result    Impression:  IMPRESSION:       1.  Marked burden of formed stool throughout the large intestine with imaging  findings suggesting fecal impaction.   2. No bowel obstruction.    3. Intrahepatic and extrahepatic biliary ductal dilatation greater than   anticipated for a postcholecystectomy state. Correlation with liver function   tests recommended, with MRCP or ERCP as appropriate clinically. Notably, similar   findings were described on outside hospital abdominal/pelvic CT report 08/24/2016       Narrative:  EXAM: CT of the abdomen and pelvis       INDICATION: Generalized weakness, diarrhea       COMPARISON: Correlation is made to report of outside hospital CT 08/24/2016;   however, these images are not available for review.       TECHNIQUE: Axial CT imaging of the abdomen and pelvis was performed with   intravenous contrast. Multiplanar reformats were generated. One or more dose   reduction techniques were used on this CT: automated exposure control,   adjustment of the mAs and/or kVp according to patient size, and iterative   reconstruction techniques.  The specific techniques used on this CT exam have   been documented in the patient's electronic medical record.  Digital Imaging and   Communications in Medicine (DICOM) format image data are available to   nonaffiliated external healthcare facilities or entities on a secure, media   free, reciprocally searchable basis with patient authorization for at least a   52-monthperiod after this study.       _______________       FINDINGS:       LOWER CHEST: Evaluation of the lung bases is mildly degraded by respiratory   motion artifact. No alveolar consolidation or pleural effusion. Cardiac size   within normal limits. No pericardial effusion.       LIVER, BILIARY: Hepatic parenchymal enhancement is uniform. There is both   intrahepatic and extrahepatic biliary ductal dilatation present in this patient   status post cholecystectomy. Dilatation of the cystic duct remnant noted.   Overall degree of CBD dilatation is estimated maximally at approximately 2.3 cm.   Smooth tapering in the region of the pancreatic head is noted. No radiopaque    choledocholithiasis.       PANCREAS: Normal pancreatic enhancement. No pancreatic ductal dilatation. No   discrete pancreatic lesion present.       SPLEEN: Small splenic hypodensity measuring approximately 1.0 cm in size,   indeterminate but favored to reflect a small cyst or hemangioma.       ADRENALS: Normal.       KIDNEYS/URETERS/BLADDER: Symmetric renal enhancement. No hydronephrosis. No   urolithiasis. Bilateral renal cysts. Urinary bladder is mildly distended. No   bladder stone.       PELVIC ORGANS: Unremarkable.       VASCULATURE: Aortobiiliac atherosclerotic calcification is present without   evidence of aneurysmal dilatation.       LYMPH NODES: No enlarged lymph nodes.       GASTROINTESTINAL TRACT: Considerable burden of formed stool throughout the large   intestine greatest in the rectum. No bowel junction. No free intraperitoneal   gas. No significant bowel wall thickening.       BONES: No acute or aggressive osseous abnormalities identified. Marked rotatory   scoliosis of the lumbar spine with advanced multilevel spondylosis and facet   joint osteoarthritis is present.       OTHER: None.       _______________               CXR Results  (Last 48 hours)  01/11/18 1031  XR CHEST PORT Final result    Impression:  IMPRESSION:       No active cardiopulmonary disease.       Narrative:  EXAM: CHEST RADIOGRAPH, SINGLE VIEW       CLINICAL INDICATION/HISTORY: Diarrhea and abdominal pain       COMPARISON: None.       TECHNIQUE: Portable frontal view of the chest was obtained.        _______________       FINDINGS:       SUPPORT DEVICES: None.       HEART AND MEDIASTINUM: Heart and pulmonary vascularity  are normal for AP   technique.       LUNGS AND PLEURAL SPACES: There is elevation of the left hemidiaphragm. Lungs   are clear. No effusion or pneumothorax.  No pleural effusion or pneumothorax.         BONY THORAX AND SOFT TISSUES: No acute osseous abnormality.       _______________                9:58 AM - I suspect that this patient has an active infection.    9:58 AM - The patient met criteria for severe sepsis at this time.      PROVIDER SEPSIS PHYSICAL EXAM EVAL  Vital signs reviewed (see nursing documentation for further details):  Vitals:    01/11/18 1330 01/11/18 1345 01/11/18 1350 01/11/18 1505   BP: '90/56 99/56 99/56 ' 106/66   Pulse: 91 89 90 82   Resp: '14 14 17 18   ' Temp:   98.4 ??F (36.9 ??C) 97.9 ??F (36.6 ??C)   SpO2: 96% 97% 98% 96%       Cardiac exam:Regular Rate    Pulmonary exam:Normal    Peripheral pulses:Normal    Capillary refill:Normal    Skin exam:pink    Exam performed byWilson, Marguerita Beards, MD  9:58 AM      IMPENDING DETERIORATION -Cardiovascular    ASSOCIATED RISK FACTORS - Dysrhythmia    MANAGEMENT- Bedside Assessment    INTERPRETATION -  CT Scan    INTERVENTIONS - hemodynamic mngmt    CASE REVIEW - Hospitalist    TREATMENT RESPONSE -Improved    PERFORMED BY - Self    NOTES   :    I have spent 30 minutes of critical care time involved in lab review, consultations with specialist, family decision- making, bedside attention and documentation. During this entire length of time I was immediately available to the patient .    Meredith Pel, MD      Discharge     Clinical Impression:   1. Abdominal pain, generalized    2. General weakness    3. Dehydration    4. NSTEMI (non-ST elevated myocardial infarction) [facility])        Disposition:  Admission    It should be noted that I will be the provider of record for this patient  Meredith Pel, MD, RDMS, FACEP    Follow-up Information    None         Current Discharge Medication List

## 2018-01-11 NOTE — ED Notes (Signed)
Telephone report to Temi RN on [phone] with all questions answered.

## 2018-01-11 NOTE — ED Notes (Signed)
Pt transported to 3019 for admission.  Pt received by Temi RN.  Pt in no distress at time of admission.

## 2018-01-11 NOTE — ED Notes (Signed)
 ED Notes by Ples Clap at 01/11/18 1359                Author: Ples Clap  Service: --  Author Type: Registered Nurse       Filed: 01/11/18 1401  Date of Service: 01/11/18 1359  Status: Signed          Editor: Ples Clap (Registered Nurse)               TRANSFER - ED to INPATIENT REPORT:      SBAR report made available to receiving floor on this patient being transferred to [facility] (TELE)  for  routine progression of care         Admitting diagnosis NSTEMI (non-ST elevated myocardial infarction) (HCC) [I21.4]      Information from the following report(s) SBAR, ED Summary and MAR was made available to receiving floor.      Lines:      Peripheral IV 01/11/18 Left Antecubital (Active)         Site Assessment  Clean, dry, & intact  01/11/2018  8:15 AM     Phlebitis Assessment  0  01/11/2018  8:15 AM     Infiltration Assessment  0  01/11/2018  8:15 AM     Dressing Status  Clean, dry, & intact  01/11/2018  8:15 AM               Peripheral IV 01/11/18 Right Antecubital (Active)         Site Assessment  Clean, dry, & intact  01/11/2018  9:42 AM     Phlebitis Assessment  0  01/11/2018  9:42 AM     Infiltration Assessment  0  01/11/2018  9:42 AM         Dressing Status  Clean, dry, & intact  01/11/2018  9:42 AM               Patient is oriented to time, place, person and situation       Valuables transported with patient          MAP (Monitor): 67 =Monitored (most recent)     Vitals w/ MEWS Score (last day)                  Date/Time  MEWS Score  Pulse  Resp  Temp  BP  Level of Consciousness  SpO2                 01/11/18 1350   2   90   17   98.4 F (36.9 C)   99/56   Alert   98 %       01/11/18 1345   --   89   14   --   99/56   --   97 %       01/11/18 1330   --   91   14   --   90/56   --   96 %       01/11/18 1315   --   92   14   --   106/64   --   95 %       01/11/18 1300   --   92   --   --   99/54   --   95 %       01/11/18 1245   --   94   18   --   99/66   --  96 %       01/11/18 1200   --   90   16    --   119/70   --   97 %       01/11/18 1100   --   84   18   --   105/61   --   97 %       01/11/18 1050   --   89   17   --   125/68   --   --       01/11/18 1030   --   91   18   --   104/56   --   96 %       01/11/18 1000   --   92   16   --   122/73   --   95 %       01/11/18 0930   --   97   16   --   138/77   --   97 %       01/11/18 0900   --   95   18   --   134/67   --   98 %       01/11/18 08:27:02   --   --   --   --   --   --   99 %       01/11/18 0819   --   104  (Abnormal)      11   --   137/66   --   --       01/11/18 0812   --   --   18   98.4 F (36.9 C)   --   Alert   --                          Septic Patients:       Lactic Acid     Lab Results         Component  Value  Date            LACPOC  2.13 (HH)  01/11/2018      (Most recent on top)   Repeat drawn: YES Time: 1356  RESULT  1.00       ALL LACTIC ACIDS GREATER THAN 2 MUST BE REPEATED POC WITHIN 4 HOURS OR PRIOR TO ADMISSION                  Total Fluid Bolus initiated and documented on MAR: YES      All ordered antibiotics initiated within first 3 hours of TIME ZERO?   YES

## 2018-01-11 NOTE — ED Provider Notes (Signed)
EMERGENCY DEPARTMENT HISTORY AND PHYSICAL EXAM      Date: 01/11/2018  Patient Name: Jennifer Holder    History of Presenting Illness     Chief Complaint   Patient presents with   ??? Diarrhea   ??? Fatigue   ??? Abdominal Pain       History Provided By: Patient      HPI/Chief Complaint: (Context):who presents with chief complaint of abdominal pain 1 day, fatigue and generalized weakness  Patient's daughter is at bedside state patient is usually does her own independent living every day and has her own apartment next to them but today she was not answering phone call and she was very fatigued and weak and has had constipation she took some mag citrate yesterday and had some loose bowel movement today otherwise there is no recent antibiotic no other pains that she is complained of no black or bloody stool  No fever no chills no chest pain or shortness of breath no back pain no focal deficits that are present right now.  Patient does have scoliosis and chronic back pain and there is no acute exacerbation of the symptoms there is no bedsores  Patient does have abdominal pain that is constant mild to moderate on history.  No radiation symptoms  Moderate in nature  Worse with palpation and movement  No alleviating factors  ----------  -------------  Patient's triage note is reviewed at 9:43 AM, patient's vitals are stable with tachycardia 104 blood pressure stable 137/66, pulse ox is 99% on room air  Patient has stool on the gown  No allergies  Home medication include Lasix methadone nitrofurantoin in the past  Past medical history of scoliosis  Surgical history of hernia repair in 2007 and cholecystectomy  Alcohol uses occasionally, former smoker  Patient's prior visits are reviewed and patient does not have significant ED visits except as outpatient visits     PCP: Whitehurst-Doss, Collie Siad, NP    Current Facility-Administered Medications   Medication Dose Route Frequency Provider Last Rate Last Dose   ??? sodium chloride (NS) flush  5-10 mL  5-10 mL IntraVENous PRN Nyoka Lint, MD       ??? verapamil (CALAN) tablet 120 mg  120 mg Oral TID Nyoka Lint, MD       ??? 0.9% sodium chloride infusion  50 mL/hr IntraVENous CONTINUOUS Nyoka Lint, MD       ??? enoxaparin (LOVENOX) injection 50 mg  1 mg/kg SubCUTAneous Q12H Nyoka Lint, MD       ??? [START ON 01/12/2018] aspirin chewable tablet 81 mg  81 mg Oral DAILY Nyoka Lint, MD       ??? atorvastatin (LIPITOR) tablet 80 mg  80 mg Oral QHS Nyoka Lint, MD       ??? [START ON 01/12/2018] polyethylene glycol (MIRALAX) packet 17 g  17 g Oral DAILY Nyoka Lint, MD       ??? [START ON 01/12/2018] senna-docusate (PERICOLACE) 8.6-50 mg per tablet 1 Tab  1 Tab Oral DAILY Nyoka Lint, MD       ??? bisacodyl (DULCOLAX) suppository 10 mg  10 mg Rectal DAILY PRN Nyoka Lint, MD       ??? acetaminophen (TYLENOL) tablet 650 mg  650 mg Oral Q4H PRN Nyoka Lint, MD       ??? ondansetron [facility]) injection 4 mg  4 mg IntraVENous Q4H PRN Nyoka Lint, MD       ???  hogg enema  500 mL Rectal ONCE Nyoka Lint, MD           Past History     Past Medical History:  Past Medical History:   Diagnosis Date   ??? Scoliosis        Past Surgical History:  Past Surgical History:   Procedure Laterality Date   ??? HX CHOLECYSTECTOMY     ??? HX HERNIA REPAIR  2007       Family History:  No family history on file.    Social History:  Social History     Tobacco Use   ??? Smoking status: Former Smoker     Years: 10.00   ??? Smokeless tobacco: Never Used   Substance Use Topics   ??? Alcohol use: Yes     Comment: coasionally   ??? Drug use: No       Allergies:  No Known Allergies      Review of Systems   Review of Systems   Constitutional: Positive for fatigue. Negative for activity change and fever.   HENT: Negative for congestion and rhinorrhea.    Eyes: Negative for visual disturbance.   Respiratory: Negative for shortness of breath.    Cardiovascular: Negative for chest pain and palpitations.   Gastrointestinal: Positive for  abdominal pain. Negative for diarrhea, nausea and vomiting.   Genitourinary: Negative for dysuria and hematuria.   Musculoskeletal: Negative for back pain.   Skin: Negative for rash.   Neurological: Negative for dizziness, weakness and light-headedness.   Psychiatric/Behavioral: Negative for agitation.   All other systems reviewed and are negative.      Physical Exam     Physical Exam   Constitutional: She appears well-developed and well-nourished. No distress.   HENT:   Head: Normocephalic and atraumatic.   Right Ear: External ear normal.   Left Ear: External ear normal.   Nose: Nose normal.   Mouth/Throat: Oropharynx is clear and moist.   Eyes: Pupils are equal, round, and reactive to light. Conjunctivae and EOM are normal. No scleral icterus.   Neck: Normal range of motion. Neck supple. No JVD present. No tracheal deviation present. No thyromegaly present.   Cardiovascular: Normal rate and regular rhythm. Exam reveals no friction rub.   No murmur heard.  Pulmonary/Chest: Effort normal and breath sounds normal. No stridor. She exhibits no tenderness.   Abdominal: Soft. Bowel sounds are normal. She exhibits no distension. There is tenderness. There is no rebound and no guarding.   No rigidity no rebound positive bowel sounds.  Mild distention is appreciated  No pulsatile mass no hernia that can be appreciated.   Musculoskeletal: Normal range of motion. She exhibits no edema or tenderness.   Lymphadenopathy:     She has no cervical adenopathy.   Neurological: She is alert. No cranial nerve deficit. Coordination normal.   Skin: Skin is warm and dry.   Psychiatric: She has a normal mood and affect. Her behavior is normal. Judgment and thought content normal.   Nursing note and vitals reviewed.      Medical Decision Making   I am the first provider for this patient.    I reviewed the vital signs, available nursing notes, past medical history, past surgical history, family history and social history.      Provider Notes  (Medical Decision Making): Patient with abdominal pain and generalized weakness  Concern for obstruction versus UTI versus intra-abdominal pathology  I will check patient's lab also include cardiac markers as  patient is fatigued patient's EKG is nonspecific does not show any sign of acute ischemia or infarction  I will check chest x-ray for pneumonia as well patient's white count of 16,000 I am starting sepsis protocol for this patient in the ED as unclear of etiology I will get CT scan abdomen as well    Vital Signs-Reviewed the patient's vital signs.    Pulse Oximetry Analysis -97%, normal, room air    Cardiac Monitor:  Rate/Rhythm: 97, sinus rhythm    EKG:  Interpreted by the EP.Time of the EKG is 9:47 AM, 99, sinus rhythm, QTC is widened to 523, slight leftward axis appreciated  There is no sign of acute STEMI or dysrhythmia.       Vitals:    01/11/18 1330 01/11/18 1345 01/11/18 1350 01/11/18 1505   BP: '90/56 99/56 99/56 '$ 106/66   Pulse: 91 89 90 82   Resp: '14 14 17 18   '$ Temp:   98.4 ??F (36.9 ??C) 97.9 ??F (36.6 ??C)   SpO2: 96% 97% 98% 96%   Weight:       Height:           Records Reviewed: Nursing Notes    ED Course:   Information communicated with patient in detail and the daughter.  Patient remained stable in the emergency department patient is awaiting a bed with the hospitalist group and on the floor.  I discussed all the information regarding elevated troponin as well and need for followed by cardiology will see her in the hospital as well repeat EKG did not show any abnormality      For Hospitalized Patients:    1. Hospitalization Decision Time:  The decision to hospitalize the patient was made by Dr. Suzanne Boron PM    2. Aspirin: Aspirin was given  ----  CRITICAL CARE NOTE :    4:17 PM      IMPENDING DETERIORATION -Cardiovascular    ASSOCIATED RISK FACTORS - Dysrhythmia    MANAGEMENT- Bedside Assessment    INTERPRETATION -  ECG    INTERVENTIONS - hemodynamic mngmt    CASE REVIEW - Hospitalist    TREATMENT  RESPONSE -Improved    PERFORMED BY - Self        NOTES   :      I have spent 30 minutes of critical care time involved in lab review, consultations with specialist, family decision- making, bedside attention and documentation. During this entire length of time I was immediately available to the patient .    Meredith Pel, MD      Diagnostic Study Results     Labs -     Recent Results (from the past 12 hour(s))   CBC WITH AUTOMATED DIFF    Collection Time: 01/11/18  8:27 AM   Result Value Ref Range    WBC 16.0 (H) 4.6 - 13.2 K/uL    RBC 3.93 (L) 4.20 - 5.30 M/uL    HGB 11.2 (L) 12.0 - 16.0 g/dL    HCT 33.6 (L) 35.0 - 45.0 %    MCV 85.5 74.0 - 97.0 FL    MCH 28.5 24.0 - 34.0 PG    MCHC 33.3 31.0 - 37.0 g/dL    RDW 15.5 (H) 11.6 - 14.5 %    PLATELET 113 (L) 135 - 420 K/uL    NEUTROPHILS 80 (H) 42 - 75 %    LYMPHOCYTES 16 (L) 20 - 51 %    MONOCYTES 4 2 - 9 %  EOSINOPHILS 0 0 - 5 %    BASOPHILS 0 0 - 3 %    ABS. NEUTROPHILS 12.8 (H) 1.8 - 8.0 K/UL    ABS. LYMPHOCYTES 2.6 0.8 - 3.5 K/UL    ABS. MONOCYTES 0.6 0 - 1.0 K/UL    ABS. EOSINOPHILS 0.0 0.0 - 0.4 K/UL    ABS. BASOPHILS 0.0 0.0 - 0.1 K/UL    PLATELET COMMENTS LARGE PLATELETS      RBC COMMENTS ANISOCYTOSIS  1+        RBC COMMENTS POIKILOCYTOSIS  1+        RBC COMMENTS SCHISTOCYTES  1+        DF MANUAL     METABOLIC PANEL, BASIC    Collection Time: 01/11/18  8:27 AM   Result Value Ref Range    Sodium 138 136 - 145 mmol/L    Potassium 3.8 3.5 - 5.5 mmol/L    Chloride 100 100 - 108 mmol/L    CO2 33 (H) 21 - 32 mmol/L    Anion gap 5 3.0 - 18 mmol/L    Glucose 118 (H) 74 - 99 mg/dL    BUN 17 7.0 - 18 MG/DL    Creatinine 0.92 0.6 - 1.3 MG/DL    BUN/Creatinine ratio 18 12 - 20      GFR est AA >60 >60 ml/min/1.13m    GFR est non-AA 57 (L) >60 ml/min/1.774m   Calcium 9.2 8.5 - 10.1 MG/DL   CARDIAC PANEL,(CK, CKMB & TROPONIN)    Collection Time: 01/11/18  8:27 AM   Result Value Ref Range    CK 136 26 - 192 U/L    CK - MB 9.4 (H) <3.6 ng/ml    CK-MB Index 6.9 (H) 0.0 -  4.0 %    Troponin-I, QT 3.01 (HH) 0.0 - 0.045 NG/ML   HEPATIC FUNCTION PANEL    Collection Time: 01/11/18  8:27 AM   Result Value Ref Range    Protein, total 7.4 6.4 - 8.2 g/dL    Albumin 4.0 3.4 - 5.0 g/dL    Globulin 3.4 2.0 - 4.0 g/dL    A-G Ratio 1.2 0.8 - 1.7      Bilirubin, total 1.0 0.2 - 1.0 MG/DL    Bilirubin, direct 0.2 0.0 - 0.2 MG/DL    Alk. phosphatase 71 45 - 117 U/L    AST (SGOT) 37 15 - 37 U/L    ALT (SGPT) 19 13 - 56 U/L   NT-PRO BNP    Collection Time: 01/11/18  8:27 AM   Result Value Ref Range    NT pro-BNP 2,693 (H) 0 - 1,800 PG/ML   URINALYSIS W/ RFLX MICROSCOPIC    Collection Time: 01/11/18  9:33 AM   Result Value Ref Range    Color YELLOW      Appearance CLEAR      Specific gravity 1.009 1.005 - 1.030      pH (UA) 8.5 (H) 5.0 - 8.0      Protein NEGATIVE  NEG mg/dL    Glucose NEGATIVE  NEG mg/dL    Ketone NEGATIVE  NEG mg/dL    Bilirubin NEGATIVE  NEG      Blood SMALL (A) NEG      Urobilinogen 1.0 0.2 - 1.0 EU/dL    Nitrites NEGATIVE  NEG      Leukocyte Esterase NEGATIVE  NEG     URINE MICROSCOPIC ONLY    Collection Time: 01/11/18  9:33 AM   Result  Value Ref Range    WBC 0 to 1 0 - 4 /hpf    RBC 4 to 10 0 - 5 /hpf    Epithelial cells FEW 0 - 5 /lpf    Bacteria 1+ (A) NEG /hpf    Amorphous Crystals 1+ (A) NEG   EKG, 12 LEAD, INITIAL    Collection Time: 01/11/18  9:47 AM   Result Value Ref Range    Ventricular Rate 99 BPM    Atrial Rate 99 BPM    P-R Interval 194 ms    QRS Duration 84 ms    Q-T Interval 408 ms    QTC Calculation (Bezet) 523 ms    Calculated P Axis 65 degrees    Calculated R Axis -16 degrees    Calculated T Axis 52 degrees    Diagnosis       Sinus rhythm with occasional premature ventricular complexes with 1st degree   AV block  Nonspecific ST and T wave abnormality  Septal infarct , age undetermined Possible  Prolonged QT  Abnormal ECG  No previous ECGs available  Confirmed by Ailene Rud ([id number]) on 01/11/2018 11:15:42 AM     POC LACTIC ACID    Collection Time: 01/11/18  9:53  AM   Result Value Ref Range    Lactic Acid (POC) 2.13 (HH) 0.40 - 2.00 mmol/L   EKG, 12 LEAD, SUBSEQUENT    Collection Time: 01/11/18 10:56 AM   Result Value Ref Range    Ventricular Rate 88 BPM    Atrial Rate 88 BPM    P-R Interval 212 ms    QRS Duration 78 ms    Q-T Interval 464 ms    QTC Calculation (Bezet) 561 ms    Calculated P Axis 35 degrees    Calculated R Axis -26 degrees    Calculated T Axis 43 degrees    Diagnosis       Sinus rhythm with 1st degree AV block  Low voltage QRS  Septal infarct (cited on or before 11-Jan-2018)  Prolonged QT  Abnormal ECG  When compared with ECG of 11-Jan-2018 09:47,  premature ventricular complexes are no longer present  Confirmed by Ailene Rud ([id number]) on 01/11/2018 11:16:22 AM     POC LACTIC ACID    Collection Time: 01/11/18  1:56 PM   Result Value Ref Range    Lactic Acid (POC) 1.00 0.40 - 2.00 mmol/L       Radiologic Studies -   CT ABD PELV W CONT   Final Result   IMPRESSION:      1.  Marked burden of formed stool throughout the large intestine with imaging   findings suggesting fecal impaction.   2. No bowel obstruction.   3. Intrahepatic and extrahepatic biliary ductal dilatation greater than   anticipated for a postcholecystectomy state. Correlation with liver function   tests recommended, with MRCP or ERCP as appropriate clinically. Notably, similar   findings were described on outside hospital abdominal/pelvic CT report 08/24/2016      XR CHEST PORT   Final Result   IMPRESSION:      No active cardiopulmonary disease.        CT Results  (Last 48 hours)               01/11/18 1119  CT ABD PELV W CONT Final result    Impression:  IMPRESSION:       1.  Marked burden of formed stool throughout the large intestine with imaging  findings suggesting fecal impaction.   2. No bowel obstruction.   3. Intrahepatic and extrahepatic biliary ductal dilatation greater than   anticipated for a postcholecystectomy state. Correlation with liver function   tests recommended, with  MRCP or ERCP as appropriate clinically. Notably, similar   findings were described on outside hospital abdominal/pelvic CT report 08/24/2016       Narrative:  EXAM: CT of the abdomen and pelvis       INDICATION: Generalized weakness, diarrhea       COMPARISON: Correlation is made to report of outside hospital CT 08/24/2016;   however, these images are not available for review.       TECHNIQUE: Axial CT imaging of the abdomen and pelvis was performed with   intravenous contrast. Multiplanar reformats were generated. One or more dose   reduction techniques were used on this CT: automated exposure control,   adjustment of the mAs and/or kVp according to patient size, and iterative   reconstruction techniques.  The specific techniques used on this CT exam have   been documented in the patient's electronic medical record.  Digital Imaging and   Communications in Medicine (DICOM) format image data are available to   nonaffiliated external healthcare facilities or entities on a secure, media   free, reciprocally searchable basis with patient authorization for at least a   27-monthperiod after this study.       _______________       FINDINGS:       LOWER CHEST: Evaluation of the lung bases is mildly degraded by respiratory   motion artifact. No alveolar consolidation or pleural effusion. Cardiac size   within normal limits. No pericardial effusion.       LIVER, BILIARY: Hepatic parenchymal enhancement is uniform. There is both   intrahepatic and extrahepatic biliary ductal dilatation present in this patient   status post cholecystectomy. Dilatation of the cystic duct remnant noted.   Overall degree of CBD dilatation is estimated maximally at approximately 2.3 cm.   Smooth tapering in the region of the pancreatic head is noted. No radiopaque   choledocholithiasis.       PANCREAS: Normal pancreatic enhancement. No pancreatic ductal dilatation. No   discrete pancreatic lesion present.       SPLEEN: Small splenic hypodensity  measuring approximately 1.0 cm in size,   indeterminate but favored to reflect a small cyst or hemangioma.       ADRENALS: Normal.       KIDNEYS/URETERS/BLADDER: Symmetric renal enhancement. No hydronephrosis. No   urolithiasis. Bilateral renal cysts. Urinary bladder is mildly distended. No   bladder stone.       PELVIC ORGANS: Unremarkable.       VASCULATURE: Aortobiiliac atherosclerotic calcification is present without   evidence of aneurysmal dilatation.       LYMPH NODES: No enlarged lymph nodes.       GASTROINTESTINAL TRACT: Considerable burden of formed stool throughout the large   intestine greatest in the rectum. No bowel junction. No free intraperitoneal   gas. No significant bowel wall thickening.       BONES: No acute or aggressive osseous abnormalities identified. Marked rotatory   scoliosis of the lumbar spine with advanced multilevel spondylosis and facet   joint osteoarthritis is present.       OTHER: None.       _______________               CXR Results  (Last 48 hours)  01/11/18 1031  XR CHEST PORT Final result    Impression:  IMPRESSION:       No active cardiopulmonary disease.       Narrative:  EXAM: CHEST RADIOGRAPH, SINGLE VIEW       CLINICAL INDICATION/HISTORY: Diarrhea and abdominal pain       COMPARISON: None.       TECHNIQUE: Portable frontal view of the chest was obtained.        _______________       FINDINGS:       SUPPORT DEVICES: None.       HEART AND MEDIASTINUM: Heart and pulmonary vascularity  are normal for AP   technique.       LUNGS AND PLEURAL SPACES: There is elevation of the left hemidiaphragm. Lungs   are clear. No effusion or pneumothorax.  No pleural effusion or pneumothorax.         BONY THORAX AND SOFT TISSUES: No acute osseous abnormality.       _______________               9:58 AM - I suspect that this patient has an active infection.    9:58 AM - The patient met criteria for severe sepsis at this time.      PROVIDER SEPSIS PHYSICAL EXAM EVAL  Vital signs  reviewed (see nursing documentation for further details):  Vitals:    01/11/18 1330 01/11/18 1345 01/11/18 1350 01/11/18 1505   BP: '90/56 99/56 99/56 '$ 106/66   Pulse: 91 89 90 82   Resp: '14 14 17 18   '$ Temp:   98.4 ??F (36.9 ??C) 97.9 ??F (36.6 ??C)   SpO2: 96% 97% 98% 96%       Cardiac exam:Regular Rate    Pulmonary exam:Normal    Peripheral pulses:Normal    Capillary refill:Normal    Skin exam:pink    Exam performed byWilson, Marguerita Beards, MD    9:58 AM      IMPENDING DETERIORATION -Cardiovascular    ASSOCIATED RISK FACTORS - Dysrhythmia    MANAGEMENT- Bedside Assessment    INTERPRETATION -  CT Scan    INTERVENTIONS - hemodynamic mngmt    CASE REVIEW - Hospitalist    TREATMENT RESPONSE -Improved    PERFORMED BY - Self    NOTES   :    I have spent 30 minutes of critical care time involved in lab review, consultations with specialist, family decision- making, bedside attention and documentation. During this entire length of time I was immediately available to the patient .    Meredith Pel, MD      Discharge     Clinical Impression:   1. Abdominal pain, generalized    2. General weakness    3. Dehydration    4. NSTEMI (non-ST elevated myocardial infarction) [facility])        Disposition:  Admission    It should be noted that I will be the provider of record for this patient  Meredith Pel, MD, RDMS, FACEP    Follow-up Information    None         Current Discharge Medication List

## 2018-01-11 NOTE — Progress Notes (Signed)
Assumed care of pt. Pt resting in bed. AxOx4. Incontinence care provided, pt has medium amount of loose, watery brown stool. No c/o pain. NAD. Call light within reach. Will continue to monitor.     2158- due meds given. Incontinence care provided. Vaginal bleeding noted during incontinence care. Will continue to monitor.     0004- reassessment complete. No changes noted. Incontinence care provided. Will continue to monitor.     [id number]- critical troponin result of 2.20 taken on pt. MD not made aware due to troponin trending down.     Bedside shift change report given to Tobi, Charity fundraiserN (Cabin crewoncoming nurse) by Adelina MingsKelsey, RN (offgoing nurse). Report included the following information SBAR, Kardex, Intake/Output, MAR, Accordion, Recent Results and Cardiac Rhythm NSR with 1st degree block.

## 2018-01-11 NOTE — Progress Notes (Signed)
Problem: Risk for Spread of Infection  Goal: Prevent transmission of infectious organism to others  Description  Prevent the transmission of infectious organisms to other patients, staff members, and visitors.  Outcome: Progressing Towards Goal     Problem: Patient Education:  Go to Education Activity  Goal: Patient/Family Education  Outcome: Progressing Towards Goal     Problem: Falls - Risk of  Goal: *Absence of Falls  Description  Document Jennifer Holder Fall Risk and appropriate interventions in the flowsheet.  Outcome: Progressing Towards Goal     Problem: Patient Education: Go to Patient Education Activity  Goal: Patient/Family Education  Outcome: Progressing Towards Goal     Problem: Pain  Goal: *Control of Pain  Outcome: Progressing Towards Goal  Goal: *PALLIATIVE CARE:  Alleviation of Pain  Outcome: Progressing Towards Goal

## 2018-01-11 NOTE — ED Notes (Signed)
Onset of symptoms last night.

## 2018-01-11 NOTE — Consults (Signed)
[facility] - Consult Note    Date of  Admission: 01/11/2018  8:06 AM   Primary Care Physician:  Melina Copa, NP  Patient seen and examined independently.  Asked to see patient after elevated troponin obtained in evaluation of lethargy and fatigue.  Patient denies chest pain or increasing shortness of breath.  However, history may not be completely reliable.  She does have a history of some type of arrhythmia for which she takes verapamil.  Agree with telemetry and trending of cardiac enzymes.  Further recommendations to follow.  Agree with assessment and plan as noted below. Dondra Prader MD   Assessment:     Patient Active Problem List   Diagnosis Code   ??? NSTEMI (non-ST elevated myocardial infarction) (HCC) I21.4     - Elevated Troponin 3.01, no chest pain or acute EKG changes  - Presented with fatigue, lethargy, diarrhea after taking Mg Citrate due to previous constipation. CT ABD pelvis with fecal impaction, no bowel obstruction  - Elevated WBC's       Plan:     - Follow cardiac enzymes   - Check echocardiogram  - Therapeutic Lovenox ordered for elevated Troponin  - Discussed with patient elevated Troponin, will follow enzyme trend for recommendation of further cardiac workup, medical management for now with ASA, statin and anticoagulation with Lovenox.     History of Present Illness:     This is a 84 y.o. female admitted for NSTEMI (non-ST elevated myocardial infarction) (HCC) [I21.4].    Patient complains of:  Presented with fatigue, weakness     This is an 84 year old female without a significant past medical history that cardiology is seeing in consult due to elevated Troponin. She explains that she felt tired and weak over the last several days, was constipated initially but then took Mg citrate and began having frequent bowel movements/diarrhea. She denies chest pain or shortness of breath. Has not had any orthopnea, PND, LE edema or palpitations. Denies cardiac history of CAD, CHF or  previous stenting.       Cardiac risk factors: post-menopausal      Review of Symptoms:  Except as stated above include:  Constitutional:  negative  Respiratory:  negative  Cardiovascular:  negative  Gastrointestinal: negative  Genitourinary:  negative  Musculoskeletal:  Negative  Neurological:  Negative  Dermatological:  Negative  Endocrinological: Negative  Psychological:  Negative    A comprehensive review of systems was negative except for that written in the HPI.     Past Medical History:     Past Medical History:   Diagnosis Date   ??? Scoliosis          Social History:     Social History     Socioeconomic History   ??? Marital status: WIDOWED     Spouse name: Not on file   ??? Number of children: Not on file   ??? Years of education: Not on file   ??? Highest education level: Not on file   Tobacco Use   ??? Smoking status: Former Smoker     Years: 10.00   ??? Smokeless tobacco: Never Used   Substance and Sexual Activity   ??? Alcohol use: Yes     Comment: coasionally   ??? Drug use: No        Family History:   No family history on file.     Medications:   No Known Allergies     Current Facility-Administered Medications   Medication  Dose Route Frequency   ??? sodium chloride (NS) flush 5-10 mL  5-10 mL IntraVENous PRN   ??? piperacillin-tazobactam (ZOSYN) 3.375 g in 0.9% sodium chloride (MBP/ADV) 100 mL MBP  3.375 g IntraVENous Q6H     Current Outpatient Medications   Medication Sig   ??? furosemide (LASIX) 40 mg tablet Take  by mouth daily.   ??? verapamil (CALAN) 120 mg tablet Take 120 mg by mouth three (3) times daily.   ??? methadone (DOLOPHINE) 10 mg tablet Take  by mouth every four (4) hours as needed for Pain.   ??? nitrofurantoin (MACRODANTIN) 50 mg capsule Take 1 Cap by mouth nightly.         Physical Exam:     Visit Vitals  BP 119/70   Pulse 90   Temp 98.4 ??F (36.9 ??C)   Resp 16   Ht 5\' 7"  (1.702 m)   Wt 116 lb (52.6 kg)   SpO2 97%   BMI 18.17 kg/m??     BP Readings from Last 3 Encounters:   01/11/18 119/70   06/17/16 118/60      Pulse Readings from Last 3 Encounters:   01/11/18 90     Wt Readings from Last 3 Encounters:   01/11/18 116 lb (52.6 kg)   06/17/16 100 lb (45.4 kg)       General:  alert, cooperative, no distress  Neck:  nontender, no JVD  Lungs:  clear to auscultation bilaterally  Heart:  regular rate and rhythm, S1, S2 normal, no murmur, click, rub or gallop  Abdomen:  abdomen is soft without significant tenderness, masses, organomegaly or guarding  Extremities:  extremities normal, atraumatic, no cyanosis or edema  Skin: Warm and dry. no hyperpigmentation, vitiligo, or suspicious lesions  Neuro: alert, oriented x3, affect appropriate  Psych: non focal     Data Review:     Recent Labs     01/11/18  0827   WBC 16.0*   HGB 11.2*   HCT 33.6*   PLT 113*     Recent Labs     01/11/18  0827   NA 138   K 3.8   CL 100   CO2 33*   GLU 118*   BUN 17   CREA 0.92   CA 9.2   ALB 4.0   SGOT 37   ALT 19       Results for orders placed or performed during the hospital encounter of 01/11/18   EKG, 12 LEAD, INITIAL   Result Value Ref Range    Ventricular Rate 99 BPM    Atrial Rate 99 BPM    P-R Interval 194 ms    QRS Duration 84 ms    Q-T Interval 408 ms    QTC Calculation (Bezet) 523 ms    Calculated P Axis 65 degrees    Calculated R Axis -16 degrees    Calculated T Axis 52 degrees    Diagnosis       Sinus rhythm with occasional premature ventricular complexes with 1st degree   AV block  Nonspecific ST and T wave abnormality  Septal infarct , age undetermined Possible  Prolonged QT  Abnormal ECG  No previous ECGs available  Confirmed by Merdis Delayallaghan, Maurice Fotheringham ([id number]) on 01/11/2018 11:15:42 AM         All Cardiac Markers in the last 24 hours:    Lab Results   Component Value Date/Time    CPK 136 01/11/2018 08:27 AM    CKMB 9.4 (H)  01/11/2018 08:27 AM    CKND1 6.9 (H) 01/11/2018 08:27 AM    TROIQ 3.01 (HH) 01/11/2018 08:27 AM       Last Lipid:  No results found for: CHOL, CHOLX, CHLST, CHOLV, HDL, LDL, LDLC, DLDLP, TGLX, TRIGL, TRIGP, CHHD,  CHHDX    Signed By: Maryanna Shape. Desiree Hane     January 11, 2018

## 2018-01-11 NOTE — Progress Notes (Signed)
Problem: Risk for Spread of Infection  Goal: Prevent transmission of infectious organism to others  Description  Prevent the transmission of infectious organisms to other patients, staff members, and visitors.  Outcome: Progressing Towards Goal     Problem: Patient Education:  Go to Education Activity  Goal: Patient/Family Education  Outcome: Progressing Towards Goal     Problem: Falls - Risk of  Goal: *Absence of Falls  Description  Document Bridgette Habermann Fall Risk and appropriate interventions in the flowsheet.  Outcome: Progressing Towards Goal     Problem: Patient Education: Go to Patient Education Activity  Goal: Patient/Family Education  Outcome: Progressing Towards Goal     Problem: Pain  Goal: *Control of Pain  Outcome: Progressing Towards Goal  Goal: *PALLIATIVE CARE:  Alleviation of Pain  Outcome: Progressing Towards Goal     Problem: Patient Education: Go to Patient Education Activity  Goal: Patient/Family Education  Outcome: Progressing Towards Goal     Problem: Patient Education: Go to Patient Education Activity  Goal: Patient/Family Education  Outcome: Progressing Towards Goal     Problem: Unstable angina/NSTEMI: Day of Admission/Day 1  Goal: Off Pathway (Use only if patient is Off Pathway)  Outcome: Progressing Towards Goal  Goal: Activity/Safety  Outcome: Progressing Towards Goal  Goal: Consults, if ordered  Outcome: Progressing Towards Goal  Goal: Diagnostic Test/Procedures  Outcome: Progressing Towards Goal  Goal: Nutrition/Diet  Outcome: Progressing Towards Goal  Goal: Discharge Planning  Outcome: Progressing Towards Goal  Goal: Medications  Outcome: Progressing Towards Goal  Goal: Respiratory  Outcome: Progressing Towards Goal  Goal: Treatments/Interventions/Procedures  Outcome: Progressing Towards Goal  Goal: Psychosocial  Outcome: Progressing Towards Goal  Goal: *Hemodynamically stable  Outcome: Progressing Towards Goal  Goal: *Optimal pain control at patient's stated goal  Outcome: Progressing  Towards Goal  Goal: *Lungs clear or at baseline  Outcome: Progressing Towards Goal     Problem: Constipation - Risk of  Goal: *Prevention of constipation  Outcome: Progressing Towards Goal  Goal: *PALLIATIVE CARE:  Prevention/Alleviation of constipation  Outcome: Progressing Towards Goal     Problem: Patient Education: Go to Patient Education Activity  Goal: Patient/Family Education  Outcome: Progressing Towards Goal

## 2018-01-11 NOTE — Progress Notes (Signed)
 Chaplain completed the initial Spiritual Assessment of the patient in bed 8 of the emergency room and offered Pastoral Care support to patient and daughter who was at bedside.Patient's daughter says that patient does have an advance directive at home and that she will provide us  a copy of all documents. Patient does not have any religious/cultural needs that will affect patient's preferences in health care. Chaplains will continue to follow and will provide pastoral care on an as needed/requested basis.    Elia Mauro Ysidro Jerline Haze Elia  Spiritual Care Department  [phone]

## 2018-01-11 NOTE — Progress Notes (Signed)
Assumed care; Pt resting in bed; no distress noted; Pt alert to person, city, year and situation.Reorientated.  Pt denies pain;bed alarm on; bed in low position; Side rails up x 3; Call light and personal belonging within reach. Will continue to monitor.   1530: Bowel Movement-Mixed soft and loose stool. C-diff order has been discontinued. Verified with Deanna ArtisKeisha, RN.   [phone]: Hogg enema. Incontinence care  1923: Critical Troponin. Dr Keane ScrapeNanul notified. Pt on Lovenox. Continue to follow Cardiology recommendation.  1930: Incontinence care.

## 2018-01-11 NOTE — ED Notes (Signed)
Pt transported to 3019 for admission.  Pt received by [facility] RN.  Pt in no distress at time of admission.

## 2018-01-11 NOTE — H&P (Signed)
H&P by Roel Cluckeprogle,  Kaiyden Simkin K, PA at 01/11/18 1710                Author: Roel Cluckeprogle, Paige Monarrez K, PA  Service: Physician Assistant  Author Type: Physician Assistant       Filed: 01/11/18 1803  Date of Service: 01/11/18 1710  Status: Attested           Editor: Roel Cluckeprogle, Aylen Rambert K, PA (Physician Assistant)  Cosigner: Gevena BarreWilson, Aaron M, MD at 01/12/18 [phone]          Attestation signed by Gevena BarreWilson, Aaron M, MD at 01/12/18 [phone]          I have interviewed the patient on my own and obtained my own physical.  I have discussed management with PA Mansur Patti and we are working collaboratively to formulate  and execute her care plan.      I agree with the History and Physical as recorded by PA Sonna Lipsky.                                         Internal Medicine History and Physical                Subjective        HPI: Jennifer Holder is a  84 y.o. female who presented to the ED with c/o abd pain. Patient's daughter states she  took mag citrate yesterday and had a large, mostly formed stool last night. She was concerned because the patient was still having abd pain today. In the ED, abd CT showed large stool burden in large intestine and possible fecal impaction, no bowel obstruction.  Patient on verapamil which may be contributing to constipation. Incidental finding on labs included troponin of 3.01. No changes on EKG. Cardiology consulted from ED. Patient will be admitted for further evaluation.       PMHx:     Past Medical History:        Diagnosis  Date         ?  Scoliosis             PSurgHx:     Past Surgical History:         Procedure  Laterality  Date          ?  HX CHOLECYSTECTOMY              ?  HX HERNIA REPAIR    2007           SocialHx:     Social History          Socioeconomic History         ?  Marital status:  WIDOWED              Spouse name:  Not on file         ?  Number of children:  Not on file     ?  Years of education:  Not on file     ?  Highest education level:  Not on file       Occupational History         ?  Not on file       Social Needs         ?  Financial resource strain:  Not on file        ?  Food insecurity:  Worry:  Not on file         Inability:  Not on file        ?  Transportation needs:              Medical:  Not on file         Non-medical:  Not on file       Tobacco Use         ?  Smoking status:  Former Smoker              Years:  10.00         ?  Smokeless tobacco:  Never Used       Substance and Sexual Activity         ?  Alcohol use:  Yes             Comment: coasionally         ?  Drug use:  No     ?  Sexual activity:  Not on file       Lifestyle        ?  Physical activity:              Days per week:  Not on file         Minutes per session:  Not on file         ?  Stress:  Not on file       Relationships        ?  Social connections:              Talks on phone:  Not on file         Gets together:  Not on file         Attends religious service:  Not on file         Active member of club or organization:  Not on file         Attends meetings of clubs or organizations:  Not on file         Relationship status:  Not on file        ?  Intimate partner violence:              Fear of current or ex partner:  Not on file         Emotionally abused:  Not on file         Physically abused:  Not on file         Forced sexual activity:  Not on file        Other Topics  Concern        ?  Not on file       Social History Narrative        ?  Not on file           FamilyHx:   No family history on file.      Home Medications:     Prior to Admission Medications     Prescriptions  Last Dose  Informant  Patient Reported?  Taking?      furosemide (LASIX) 40 mg tablet      Yes  No      Sig: Take  by mouth daily.      methadone (DOLOPHINE) 10 mg tablet      Yes  No      Sig: Take  by mouth every four (4) hours as needed for Pain.  nitrofurantoin (MACRODANTIN) 50 mg capsule      No  No      Sig: Take 1 Cap by mouth nightly.      verapamil (CALAN) 120 mg tablet      Yes  No      Sig: Take 120 mg by mouth  three (3) times daily.               Facility-Administered Medications: None           Allergies:   No Known Allergies       Review of Systems:   CONST: Fever, weight loss, fatigue or chills   HEENT: Recent changes in vision, vertigo, epistaxis, dysphagia and hoarseness  CV: Chest pain, palpitations, HTN, edema and varicosities  RESP: Cough, shortness of breath, wheezing, hemoptysis, snoring and reactive airway disease   GI: Nausea, vomiting, abdominal pain, change in bowel habits, hematochezia, melena, and GERD   GU: Hematuria, dysuria, frequency, urgency, nocturia and stress urinary incontinence    MS: Weakness, joint pain and arthritis  ENDO: Diabetes, thyroid disease, polyuria, polydipsia, polyphagia, poor wound healing, heat intolerance, cold intolerance  LYMPH/HEME: Anemia, bruising and history of blood transfusions   INTEG: Dermatitis, abnormal moles  NEURO: Dizziness, headache, fainting, seizures and stroke  PSYCH: Anxiety and depression           Objective         Visit Vitals      BP  106/66     Pulse  82     Temp  97.9 ??F (36.6 ??C)     Resp  18     Ht  5\' 7"  (1.702 m)     Wt  52.6 kg (116 lb)     SpO2  96%        BMI  18.17 kg/m??           Physical Exam:   General Appearance: NAD, conversant   HENT: normocephalic/atraumatic, moist mucus membranes   Lungs: CTA with normal respiratory effort   Cardiovascular: RRR, no m/r/g   Abdomen: soft, diffuse lower abd TTP, normal bowel sounds   Extremities: no cyanosis, no peripheral edema   Neuro: moves all extremities, no focal deficits   Psych: appropriate affect, alert and oriented to person, place and time      Laboratory Studies:   BMP:      Lab Results         Component  Value  Date/Time            NA  138  01/11/2018 08:27 AM       K  3.8  01/11/2018 08:27 AM       CL  100  01/11/2018 08:27 AM       CO2  33 (H)  01/11/2018 08:27 AM       AGAP  5  01/11/2018 08:27 AM       GLU  118 (H)  01/11/2018 08:27 AM       BUN  17  01/11/2018 08:27 AM       CREA  0.92   01/11/2018 08:27 AM       GFRAA  >60  01/11/2018 08:27 AM            GFRNA  57 (L)  01/11/2018 08:27 AM        CBC:      Lab Results         Component  Value  Date/Time  WBC  16.0 (H)  01/11/2018 08:27 AM       HGB  11.2 (L)  01/11/2018 08:27 AM       HCT  33.6 (L)  01/11/2018 08:27 AM            PLT  113 (L)  01/11/2018 08:27 AM           Imaging Reviewed:   Ct Abd Pelv W Cont      Result Date: 01/11/2018   EXAM: CT of the abdomen and pelvis INDICATION: Generalized weakness, diarrhea COMPARISON: Correlation is made to report of outside hospital CT 08/24/2016; however, these images are not available for review. TECHNIQUE: Axial CT imaging of the abdomen and  pelvis was performed with intravenous contrast. Multiplanar reformats were generated. One or more dose reduction techniques were used on this CT: automated exposure control, adjustment of the mAs and/or kVp according to patient size, and iterative reconstruction  techniques.  The specific techniques used on this CT exam have been documented in the patient's electronic medical record.  Digital Imaging and Communications in Medicine (DICOM) format image data are available to nonaffiliated external healthcare facilities  or entities on a secure, media free, reciprocally searchable basis with patient authorization for at least a 12-month period after this study. _______________ FINDINGS: LOWER CHEST: Evaluation of the lung bases is mildly degraded by respiratory motion  artifact. No alveolar consolidation or pleural effusion. Cardiac size within normal limits. No pericardial effusion. LIVER, BILIARY: Hepatic parenchymal enhancement is uniform. There is both intrahepatic and extrahepatic biliary ductal dilatation present  in this patient status post cholecystectomy. Dilatation of the cystic duct remnant noted. Overall degree of CBD dilatation is estimated maximally at approximately 2.3 cm. Smooth tapering in the region of the pancreatic head is noted. No  radiopaque choledocholithiasis.  PANCREAS: Normal pancreatic enhancement. No pancreatic ductal dilatation. No discrete pancreatic lesion present. SPLEEN: Small splenic hypodensity measuring approximately 1.0 cm in size, indeterminate but favored to reflect a small cyst or hemangioma.  ADRENALS: Normal. KIDNEYS/URETERS/BLADDER: Symmetric renal enhancement. No hydronephrosis. No urolithiasis. Bilateral renal cysts. Urinary bladder is mildly distended. No bladder stone. PELVIC ORGANS: Unremarkable. VASCULATURE: Aortobiiliac atherosclerotic  calcification is present without evidence of aneurysmal dilatation. LYMPH NODES: No enlarged lymph nodes. GASTROINTESTINAL TRACT: Considerable burden of formed stool throughout the large intestine greatest in the rectum. No bowel junction. No free intraperitoneal  gas. No significant bowel wall thickening. BONES: No acute or aggressive osseous abnormalities identified. 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 OTHER: None. _______________       IMPRESSION: 1.  Marked burden of formed stool throughout the large intestine with imaging findings suggesting fecal impaction. 2. No bowel obstruction. 3. Intrahepatic and extrahepatic biliary ductal dilatation greater than anticipated for a postcholecystectomy  state. Correlation with liver function tests recommended, with MRCP or ERCP as appropriate clinically. Notably, similar findings were described on outside hospital abdominal/pelvic CT report 08/24/2016      Xr Chest Port      Result Date: 01/11/2018   EXAM: CHEST RADIOGRAPH, SINGLE VIEW CLINICAL INDICATION/HISTORY: Diarrhea and abdominal pain COMPARISON: None. TECHNIQUE: Portable frontal view of the chest was obtained. _______________ FINDINGS: SUPPORT DEVICES: None. HEART AND MEDIASTINUM: Heart and  pulmonary vascularity  are normal for AP technique. LUNGS AND PLEURAL SPACES: There is elevation of the  left hemidiaphragm. Lungs are clear. No effusion or pneumothorax.  No pleural effusion or pneumothorax.  BONY THORAX AND SOFT  TISSUES: No acute osseous  abnormality. _______________       IMPRESSION: No active cardiopulmonary disease.         EKG:    Sinus rhythm with 1st degree AV block   Low voltage QRS   Septal infarct (cited on or before 11-Jan-2018)   Prolonged QT   Abnormal ECG   When compared with  ECG of 11-Jan-2018 09:47,   premature ventricular complexes are no longer present            Assessment/Plan        Principal Problem:     NSTEMI (non-ST elevated myocardial infarction) (HCC) (01/11/2018)      Active Problems:     Constipation (01/11/2018)      NSTEMI   - cardiology consulted - appreciate   - trend troponin   - echo   - therapeutic lovenox   - asa/statin      Constipation   -  Enema, miralax, pericolace   - recommend daily stool softener at discharge   - verapamil alternative?         - Cont acceptable home medications for chronic conditions    - DVT protocol      I have personally reviewed all pertinent labs, films and EKGs that have officially resulted. I reviewed available electronic documentation outlining the initial presentation as well as the emergency room physician's encounter.      Ulyess Blossom, PA-C   [facility]   Hospitalist Division   Office:  [phone]   Pager: [phone]

## 2018-01-11 NOTE — ED Notes (Signed)
[phone] with all questions answered.

## 2018-01-11 NOTE — ED Notes (Signed)
md wilson returned page to md singh

## 2018-01-11 NOTE — ED Notes (Signed)
Pt cleaned of stool and placed in a gown.  Stool appeared formed and not diarrhea like reported

## 2018-01-11 NOTE — Progress Notes (Signed)
Problem: Risk for Spread of Infection  Goal: Prevent transmission of infectious organism to others  Description  Prevent the transmission of infectious organisms to other patients, staff members, and visitors.  Outcome: Progressing Towards Goal     Problem: Patient Education:  Go to Education Activity  Goal: Patient/Family Education  Outcome: Progressing Towards Goal     Problem: Falls - Risk of  Goal: *Absence of Falls  Description  Document Bridgette HabermannSchmid Fall Risk and appropriate interventions in the flowsheet.  Outcome: Progressing Towards Goal     Problem: Patient Education: Go to Patient Education Activity  Goal: Patient/Family Education  Outcome: Progressing Towards Goal     Problem: Pain  Goal: *Control of Pain  Outcome: Progressing Towards Goal  Goal: *PALLIATIVE CARE:  Alleviation of Pain  Outcome: Progressing Towards Goal     Problem: Patient Education: Go to Patient Education Activity  Goal: Patient/Family Education  Outcome: Progressing Towards Goal     Problem: Patient Education: Go to Patient Education Activity  Goal: Patient/Family Education  Outcome: Progressing Towards Goal     Problem: Unstable angina/NSTEMI: Day of Admission/Day 1  Goal: Off Pathway (Use only if patient is Off Pathway)  Outcome: Progressing Towards Goal  Goal: Activity/Safety  Outcome: Progressing Towards Goal  Goal: Consults, if ordered  Outcome: Progressing Towards Goal  Goal: Diagnostic Test/Procedures  Outcome: Progressing Towards Goal  Goal: Nutrition/Diet  Outcome: Progressing Towards Goal  Goal: Discharge Planning  Outcome: Progressing Towards Goal  Goal: Medications  Outcome: Progressing Towards Goal  Goal: Respiratory  Outcome: Progressing Towards Goal  Goal: Treatments/Interventions/Procedures  Outcome: Progressing Towards Goal  Goal: Psychosocial  Outcome: Progressing Towards Goal  Goal: *Hemodynamically stable  Outcome: Progressing Towards Goal  Goal: *Optimal pain control at patient's stated goal   Outcome: Progressing Towards Goal  Goal: *Lungs clear or at baseline  Outcome: Progressing Towards Goal     Problem: Constipation - Risk of  Goal: *Prevention of constipation  Outcome: Progressing Towards Goal  Goal: *PALLIATIVE CARE:  Prevention/Alleviation of constipation  Outcome: Progressing Towards Goal     Problem: Patient Education: Go to Patient Education Activity  Goal: Patient/Family Education  Outcome: Progressing Towards Goal

## 2018-01-11 NOTE — Progress Notes (Addendum)
Assumed care; Pt resting in bed; no distress noted; Pt alert to person, city, year and situation.Reorientated.  Pt denies pain;bed alarm on; bed in low position; Side rails up x 3; Call light and personal belonging within reach. Will continue to monitor.   1530: Bowel Movement-Mixed soft and loose stool. C-diff order has been discontinued. Verified with Keisha, RN.   1830: Hogg enema. Incontinence care  1923: Critical Troponin. Dr Nanul notified. Pt on Lovenox. Continue to follow Cardiology recommendation.  1930: Incontinence care.

## 2018-01-11 NOTE — Other (Signed)
In patient's chart assisting primary nurse with patient medications and patient care.

## 2018-01-11 NOTE — ED Notes (Signed)
TRANSFER - ED to INPATIENT REPORT:    SBAR report made available to receiving floor on this patient being transferred to [facility] (TELE)  for routine progression of care       Admitting diagnosis NSTEMI (non-ST elevated myocardial infarction) (HCC) [I21.4]    Information from the following report(s) SBAR, ED Summary and MAR was made available to receiving floor.    Lines:   Peripheral IV 01/11/18 Left Antecubital (Active)   Site Assessment Clean, dry, & intact 01/11/2018  8:15 AM   Phlebitis Assessment 0 01/11/2018  8:15 AM   Infiltration Assessment 0 01/11/2018  8:15 AM   Dressing Status Clean, dry, & intact 01/11/2018  8:15 AM       Peripheral IV 01/11/18 Right Antecubital (Active)   Site Assessment Clean, dry, & intact 01/11/2018  9:42 AM   Phlebitis Assessment 0 01/11/2018  9:42 AM   Infiltration Assessment 0 01/11/2018  9:42 AM   Dressing Status Clean, dry, & intact 01/11/2018  9:42 AM          Patient is oriented to time, place, person and situation     Valuables transported with patient       MAP (Monitor): 67 =Monitored (most recent)  Vitals w/ MEWS Score (last day)     Date/Time MEWS Score Pulse Resp Temp BP Level of Consciousness SpO2    01/11/18 1350  2  90  17  98.4 ??F (36.9 ??C)  99/56  Alert  98 %    01/11/18 1345  ???  89  14  ???  99/56  ???  97 %    01/11/18 1330  ???  91  14  ???  90/56  ???  96 %    01/11/18 1315  ???  92  14  ???  106/64  ???  95 %    01/11/18 1300  ???  92  ???  ???  99/54  ???  95 %    01/11/18 1245  ???  94  18  ???  99/66  ???  96 %    01/11/18 1200  ???  90  16  ???  119/70  ???  97 %    01/11/18 1100  ???  84  18  ???  105/61  ???  97 %    01/11/18 1050  ???  89  17  ???  125/68  ???  ???    01/11/18 1030  ???  91  18  ???  104/56  ???  96 %    01/11/18 1000  ???  92  16  ???  122/73  ???  95 %    01/11/18 0930  ???  97  16  ???  138/77  ???  97 %    01/11/18 0900  ???  95  18  ???  134/67  ???  98 %    01/11/18 08:27:02  ???  ???  ???  ???  ???  ???  99 %    01/11/18 0819  ???  104  (Abnormal)   11  ???  137/66  ???  ???    01/11/18 0812  ???  ???  18  98.4 ??F (36.9 ??C)  ???  Alert  ???               Septic Patients:     Lactic Acid  Lab Results   Component Value Date    LACPOC 2.13 (HH) 01/11/2018    (Most  recent on top)  Repeat drawn: YES Time: 1356  RESULT  1.00     ALL LACTIC ACIDS GREATER THAN 2 MUST BE REPEATED POC WITHIN 4 HOURS OR PRIOR TO ADMISSION               Total Fluid Bolus initiated and documented on MAR: YES    All ordered antibiotics initiated within first 3 hours of TIME ZERO?  YES

## 2018-01-11 NOTE — Consults (Addendum)
[facility] - Consult Note    Date of  Admission: 01/11/2018  8:06 AM   Primary Care Physician:  Melina CopaWhitehurst-Doss, Sue, NP  Patient seen and examined independently.  Asked to see patient after elevated troponin obtained in evaluation of lethargy and fatigue.  Patient denies chest pain or increasing shortness of breath.  However, history may not be completely reliable.  She does have a history of some type of arrhythmia for which she takes verapamil.  Agree with telemetry and trending of cardiac enzymes.  Further recommendations to follow.  Agree with assessment and plan as noted below. Dondra PraderW Shayonna Ocampo MD   Assessment:     Patient Active Problem List   Diagnosis Code   ??? NSTEMI (non-ST elevated myocardial infarction) (HCC) I21.4     - Elevated Troponin 3.01, no chest pain or acute EKG changes  - Presented with fatigue, lethargy, diarrhea after taking Mg Citrate due to previous constipation. CT ABD pelvis with fecal impaction, no bowel obstruction  - Elevated WBC's       Plan:     - Follow cardiac enzymes   - Check echocardiogram  - Therapeutic Lovenox ordered for elevated Troponin  - Discussed with patient elevated Troponin, will follow enzyme trend for recommendation of further cardiac workup, medical management for now with ASA, statin and anticoagulation with Lovenox.     History of Present Illness:     This is a 84 y.o. female admitted for NSTEMI (non-ST elevated myocardial infarction) (HCC) [I21.4].    Patient complains of:  Presented with fatigue, weakness     This is an 84 year old female without a significant past medical history that cardiology is seeing in consult due to elevated Troponin. She explains that she felt tired and weak over the last several days, was constipated initially but then took Mg citrate and began having frequent bowel movements/diarrhea. She denies chest pain or shortness of breath. Has not had any orthopnea, PND, LE edema or palpitations. Denies cardiac  history of CAD, CHF or previous stenting.       Cardiac risk factors: post-menopausal      Review of Symptoms:  Except as stated above include:  Constitutional:  negative  Respiratory:  negative  Cardiovascular:  negative  Gastrointestinal: negative  Genitourinary:  negative  Musculoskeletal:  Negative  Neurological:  Negative  Dermatological:  Negative  Endocrinological: Negative  Psychological:  Negative    A comprehensive review of systems was negative except for that written in the HPI.     Past Medical History:     Past Medical History:   Diagnosis Date   ??? Scoliosis          Social History:     Social History     Socioeconomic History   ??? Marital status: WIDOWED     Spouse name: Not on file   ??? Number of children: Not on file   ??? Years of education: Not on file   ??? Highest education level: Not on file   Tobacco Use   ??? Smoking status: Former Smoker     Years: 10.00   ??? Smokeless tobacco: Never Used   Substance and Sexual Activity   ??? Alcohol use: Yes     Comment: coasionally   ??? Drug use: No        Family History:   No family history on file.     Medications:   No Known Allergies     Current Facility-Administered Medications   Medication  Dose Route Frequency   ??? sodium chloride (NS) flush 5-10 mL  5-10 mL IntraVENous PRN   ??? piperacillin-tazobactam (ZOSYN) 3.375 g in 0.9% sodium chloride (MBP/ADV) 100 mL MBP  3.375 g IntraVENous Q6H     Current Outpatient Medications   Medication Sig   ??? furosemide (LASIX) 40 mg tablet Take  by mouth daily.   ??? verapamil (CALAN) 120 mg tablet Take 120 mg by mouth three (3) times daily.   ??? methadone (DOLOPHINE) 10 mg tablet Take  by mouth every four (4) hours as needed for Pain.   ??? nitrofurantoin (MACRODANTIN) 50 mg capsule Take 1 Cap by mouth nightly.         Physical Exam:     Visit Vitals  BP 119/70   Pulse 90   Temp 98.4 ??F (36.9 ??C)   Resp 16   Ht 5\' 7"  (1.702 m)   Wt 116 lb (52.6 kg)   SpO2 97%   BMI 18.17 kg/m??     BP Readings from Last 3 Encounters:    01/11/18 119/70   06/17/16 118/60     Pulse Readings from Last 3 Encounters:   01/11/18 90     Wt Readings from Last 3 Encounters:   01/11/18 116 lb (52.6 kg)   06/17/16 100 lb (45.4 kg)       General:  alert, cooperative, no distress  Neck:  nontender, no JVD  Lungs:  clear to auscultation bilaterally  Heart:  regular rate and rhythm, S1, S2 normal, no murmur, click, rub or gallop  Abdomen:  abdomen is soft without significant tenderness, masses, organomegaly or guarding  Extremities:  extremities normal, atraumatic, no cyanosis or edema  Skin: Warm and dry. no hyperpigmentation, vitiligo, or suspicious lesions  Neuro: alert, oriented x3, affect appropriate  Psych: non focal     Data Review:     Recent Labs     01/11/18  0827   WBC 16.0*   HGB 11.2*   HCT 33.6*   PLT 113*     Recent Labs     01/11/18  0827   NA 138   K 3.8   CL 100   CO2 33*   GLU 118*   BUN 17   CREA 0.92   CA 9.2   ALB 4.0   SGOT 37   ALT 19       Results for orders placed or performed during the hospital encounter of 01/11/18   EKG, 12 LEAD, INITIAL   Result Value Ref Range    Ventricular Rate 99 BPM    Atrial Rate 99 BPM    P-R Interval 194 ms    QRS Duration 84 ms    Q-T Interval 408 ms    QTC Calculation (Bezet) 523 ms    Calculated P Axis 65 degrees    Calculated R Axis -16 degrees    Calculated T Axis 52 degrees    Diagnosis       Sinus rhythm with occasional premature ventricular complexes with 1st degree   AV block  Nonspecific ST and T wave abnormality  Septal infarct , age undetermined Possible  Prolonged QT  Abnormal ECG  No previous ECGs available  Confirmed by Merdis Delay ([id number]) on 01/11/2018 11:15:42 AM         All Cardiac Markers in the last 24 hours:    Lab Results   Component Value Date/Time    CPK 136 01/11/2018 08:27 AM    CKMB 9.4 (H)  01/11/2018 08:27 AM    CKND1 6.9 (H) 01/11/2018 08:27 AM    TROIQ 3.01 (HH) 01/11/2018 08:27 AM       Last Lipid:  No results found for: CHOL, CHOLX, CHLST, CHOLV, HDL, LDL,  LDLC, DLDLP, TGLX, TRIGL, TRIGP, CHHD, CHHDX    Signed By: Maryanna Shape. Desiree Hane     January 11, 2018

## 2018-01-11 NOTE — ED Notes (Signed)
Pt cleaned of stool and placed in a gown.  Stool appeared formed and not diarrhea like reported

## 2018-01-11 NOTE — H&P (Signed)
Internal Medicine History and Physical          Subjective     HPI: Jennifer Holder is a 84 y.o. female who presented to the ED with c/o abd pain. Patient's daughter states she took mag citrate yesterday and had a large, mostly formed stool last night. She was concerned because the patient was still having abd pain today. In the ED, abd CT showed large stool burden in large intestine and possible fecal impaction, no bowel obstruction. Patient on verapamil which may be contributing to constipation. Incidental finding on labs included troponin of 3.01. No changes on EKG. Cardiology consulted from ED. Patient will be admitted for further evaluation.     PMHx:  Past Medical History:   Diagnosis Date   ??? Scoliosis        PSurgHx:  Past Surgical History:   Procedure Laterality Date   ??? HX CHOLECYSTECTOMY     ??? HX HERNIA REPAIR  2007       SocialHx:  Social History     Socioeconomic History   ??? Marital status: WIDOWED     Spouse name: Not on file   ??? Number of children: Not on file   ??? Years of education: Not on file   ??? Highest education level: Not on file   Occupational History   ??? Not on file   Social Needs   ??? Financial resource strain: Not on file   ??? Food insecurity:     Worry: Not on file     Inability: Not on file   ??? Transportation needs:     Medical: Not on file     Non-medical: Not on file   Tobacco Use   ??? Smoking status: Former Smoker     Years: 10.00   ??? Smokeless tobacco: Never Used   Substance and Sexual Activity   ??? Alcohol use: Yes     Comment: coasionally   ??? Drug use: No   ??? Sexual activity: Not on file   Lifestyle   ??? Physical activity:     Days per week: Not on file     Minutes per session: Not on file   ??? Stress: Not on file   Relationships   ??? Social connections:     Talks on phone: Not on file     Gets together: Not on file     Attends religious service: Not on file     Active member of club or organization: Not on file     Attends meetings of clubs or organizations: Not on file      Relationship status: Not on file   ??? Intimate partner violence:     Fear of current or ex partner: Not on file     Emotionally abused: Not on file     Physically abused: Not on file     Forced sexual activity: Not on file   Other Topics Concern   ??? Not on file   Social History Narrative   ??? Not on file       FamilyHx:  No family history on file.    Home Medications:  Prior to Admission Medications   Prescriptions Last Dose Informant Patient Reported? Taking?   furosemide (LASIX) 40 mg tablet   Yes No   Sig: Take  by mouth daily.   methadone (DOLOPHINE) 10 mg tablet   Yes No   Sig: Take  by mouth every four (4) hours as needed for Pain.  nitrofurantoin (MACRODANTIN) 50 mg capsule   No No   Sig: Take 1 Cap by mouth nightly.   verapamil (CALAN) 120 mg tablet   Yes No   Sig: Take 120 mg by mouth three (3) times daily.      Facility-Administered Medications: None       Allergies:  No Known Allergies     Review of Systems:  CONST: Fever, weight loss, fatigue or chills  HEENT: Recent changes in vision, vertigo, epistaxis, dysphagia and hoarseness  CV: Chest pain, palpitations, HTN, edema and varicosities  RESP: Cough, shortness of breath, wheezing, hemoptysis, snoring and reactive airway disease  GI: Nausea, vomiting, abdominal pain, change in bowel habits, hematochezia, melena, and GERD   GU: Hematuria, dysuria, frequency, urgency, nocturia and stress urinary incontinence   MS: Weakness, joint pain and arthritis  ENDO: Diabetes, thyroid disease, polyuria, polydipsia, polyphagia, poor wound healing, heat intolerance, cold intolerance  LYMPH/HEME: Anemia, bruising and history of blood transfusions  INTEG: Dermatitis, abnormal moles  NEURO: Dizziness, headache, fainting, seizures and stroke  PSYCH: Anxiety and depression      Objective      Visit Vitals  BP 106/66   Pulse 82   Temp 97.9 ??F (36.6 ??C)   Resp 18   Ht 5\' 7"  (1.702 m)   Wt 52.6 kg (116 lb)   SpO2 96%   BMI 18.17 kg/m??       Physical Exam:   General Appearance: NAD, conversant  HENT: normocephalic/atraumatic, moist mucus membranes  Lungs: CTA with normal respiratory effort  Cardiovascular: RRR, no m/r/g  Abdomen: soft, diffuse lower abd TTP, normal bowel sounds  Extremities: no cyanosis, no peripheral edema  Neuro: moves all extremities, no focal deficits  Psych: appropriate affect, alert and oriented to person, place and time    Laboratory Studies:  BMP:   Lab Results   Component Value Date/Time    NA 138 01/11/2018 08:27 AM    K 3.8 01/11/2018 08:27 AM    CL 100 01/11/2018 08:27 AM    CO2 33 (H) 01/11/2018 08:27 AM    AGAP 5 01/11/2018 08:27 AM    GLU 118 (H) 01/11/2018 08:27 AM    BUN 17 01/11/2018 08:27 AM    CREA 0.92 01/11/2018 08:27 AM    GFRAA >60 01/11/2018 08:27 AM    GFRNA 57 (L) 01/11/2018 08:27 AM     CBC:   Lab Results   Component Value Date/Time    WBC 16.0 (H) 01/11/2018 08:27 AM    HGB 11.2 (L) 01/11/2018 08:27 AM    HCT 33.6 (L) 01/11/2018 08:27 AM    PLT 113 (L) 01/11/2018 08:27 AM       Imaging Reviewed:  Ct Abd Pelv W Cont    Result Date: 01/11/2018  EXAM: CT of the abdomen and pelvis INDICATION: Generalized weakness, diarrhea COMPARISON: Correlation is made to report of outside hospital CT 08/24/2016; however, these images are not available for review. TECHNIQUE: Axial CT imaging of the abdomen and pelvis was performed with intravenous contrast. Multiplanar reformats were generated. One or more dose reduction techniques were used on this CT: automated exposure control, adjustment of the mAs and/or kVp according to patient size, and iterative reconstruction techniques.  The specific techniques used on this CT exam have been documented in the patient's electronic medical record.  Digital Imaging and Communications in Medicine (DICOM) format image data are available to nonaffiliated external healthcare facilities or entities on a secure, media free, reciprocally searchable basis with  patient authorization for  at least a 34-month period after this study. _______________ FINDINGS: LOWER CHEST: Evaluation of the lung bases is mildly degraded by respiratory motion artifact. No alveolar consolidation or pleural effusion. Cardiac size within normal limits. No pericardial effusion. LIVER, BILIARY: Hepatic parenchymal enhancement is uniform. There is both intrahepatic and extrahepatic biliary ductal dilatation present in this patient status post cholecystectomy. Dilatation of the cystic duct remnant noted. Overall degree of CBD dilatation is estimated maximally at approximately 2.3 cm. Smooth tapering in the region of the pancreatic head is noted. No radiopaque choledocholithiasis. PANCREAS: Normal pancreatic enhancement. No pancreatic ductal dilatation. No discrete pancreatic lesion present. SPLEEN: Small splenic hypodensity measuring approximately 1.0 cm in size, indeterminate but favored to reflect a small cyst or hemangioma. ADRENALS: Normal. KIDNEYS/URETERS/BLADDER: Symmetric renal enhancement. No hydronephrosis. No urolithiasis. Bilateral renal cysts. Urinary bladder is mildly distended. No bladder stone. PELVIC ORGANS: Unremarkable. VASCULATURE: Aortobiiliac atherosclerotic calcification is present without evidence of aneurysmal dilatation. LYMPH NODES: No enlarged lymph nodes. GASTROINTESTINAL TRACT: Considerable burden of formed stool throughout the large intestine greatest in the rectum. No bowel junction. No free intraperitoneal gas. No significant bowel wall thickening. BONES: No acute or aggressive osseous abnormalities identified. 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 OTHER: None. _______________     IMPRESSION: 1.  Marked burden of formed stool throughout the large intestine with imaging findings suggesting fecal impaction. 2. No bowel obstruction. 3. Intrahepatic and extrahepatic biliary ductal dilatation  greater than anticipated for a postcholecystectomy state. Correlation with liver function tests recommended, with MRCP or ERCP as appropriate clinically. Notably, similar findings were described on outside hospital abdominal/pelvic CT report 08/24/2016    Xr Chest Port    Result Date: 01/11/2018  EXAM: CHEST RADIOGRAPH, SINGLE VIEW CLINICAL INDICATION/HISTORY: Diarrhea and abdominal pain COMPARISON: None. TECHNIQUE: Portable frontal view of the chest was obtained. _______________ FINDINGS: SUPPORT DEVICES: None. HEART AND MEDIASTINUM: Heart and pulmonary vascularity  are normal for AP technique. LUNGS AND PLEURAL SPACES: There is elevation of the left hemidiaphragm. Lungs are clear. No effusion or pneumothorax.  No pleural effusion or pneumothorax.  BONY THORAX AND SOFT TISSUES: No acute osseous abnormality. _______________     IMPRESSION: No active cardiopulmonary disease.      EKG:   Sinus rhythm with 1st degree AV block   Low voltage QRS   Septal infarct (cited on or before 11-Jan-2018)   Prolonged QT   Abnormal ECG   When compared with ECG of 11-Jan-2018 09:47,   premature ventricular complexes are no longer present       Assessment/Plan     Principal Problem:    NSTEMI (non-ST elevated myocardial infarction) (HCC) (01/11/2018)    Active Problems:    Constipation (01/11/2018)    NSTEMI  - cardiology consulted - appreciate  - trend troponin  - echo  - therapeutic lovenox  - asa/statin    Constipation  -  Enema, miralax, pericolace  - recommend daily stool softener at discharge  - verapamil alternative?      - Cont acceptable home medications for chronic conditions   - DVT protocol    I have personally reviewed all pertinent labs, films and EKGs that have officially resulted. I reviewed available electronic documentation outlining the initial presentation as well as the emergency room physician's encounter.    Ulyess Blossom, PA-C  [facility]  Hospitalist Division  Office:  [phone]   Pager: [phone]

## 2018-01-11 NOTE — Progress Notes (Signed)
Chaplain completed the initial Spiritual Assessment of the patient in bed 8 of the emergency room and offered Pastoral Care support to patient and daughter who was at bedside.Patient's daughter says that patient does have an advance directive at home and that she will provide us a copy of all documents. Patient does not have any religious/cultural needs that will affect patient???s preferences in health care. Chaplains will continue to follow and will provide pastoral care on an as needed/requested basis.    Chaplain Ernie Etheridge  Board Certified Chaplain  Spiritual Care Department  [phone]

## 2018-01-11 NOTE — ED Triage Notes (Signed)
Onset of symptoms last night

## 2018-01-11 NOTE — Progress Notes (Addendum)
Assumed care of pt. Pt resting in bed. AxOx4. Incontinence care provided, pt has medium amount of loose, watery brown stool. No c/o pain. NAD. Call light within reach. Will continue to monitor.     2158- due meds given. Incontinence care provided. Vaginal bleeding noted during incontinence care. Will continue to monitor.     0004- reassessment complete. No changes noted. Incontinence care provided. Will continue to monitor.     0604- critical troponin result of 2.20 taken on pt. MD not made aware due to troponin trending down.     Bedside shift change report given to Tobi, RN (oncoming nurse) by Kelsey, RN (offgoing nurse). Report included the following information SBAR, Kardex, Intake/Output, MAR, Accordion, Recent Results and Cardiac Rhythm NSR with 1st degree block.

## 2018-01-11 NOTE — Progress Notes (Signed)
Problem: Risk for Spread of Infection  Goal: Prevent transmission of infectious organism to others  Description  Prevent the transmission of infectious organisms to other patients, staff members, and visitors.  Outcome: Progressing Towards Goal     Problem: Patient Education:  Go to Education Activity  Goal: Patient/Family Education  Outcome: Progressing Towards Goal     Problem: Falls - Risk of  Goal: *Absence of Falls  Description  Document Schmid Fall Risk and appropriate interventions in the flowsheet.  Outcome: Progressing Towards Goal     Problem: Patient Education: Go to Patient Education Activity  Goal: Patient/Family Education  Outcome: Progressing Towards Goal     Problem: Pain  Goal: *Control of Pain  Outcome: Progressing Towards Goal  Goal: *PALLIATIVE CARE:  Alleviation of Pain  Outcome: Progressing Towards Goal

## 2018-01-11 NOTE — ED Notes (Signed)
md wilson returned page to md singh

## 2018-01-12 ENCOUNTER — Inpatient Hospital Stay: Admit: 2018-01-12 | Payer: MEDICARE | Primary: Family

## 2018-01-12 LAB — METABOLIC PANEL, BASIC
Anion gap: 6 mmol/L (ref 3.0–18)
BUN/Creatinine ratio: 19 (ref 12–20)
BUN: 13 MG/DL (ref 7.0–18)
CO2: 30 mmol/L (ref 21–32)
Calcium: 8.4 MG/DL — ABNORMAL LOW (ref 8.5–10.1)
Chloride: 105 mmol/L (ref 100–108)
Creatinine: 0.67 MG/DL (ref 0.6–1.3)
GFR est AA: >60 mL/min/{1.73_m2} (ref >60)
GFR est non-AA: >60 mL/min/{1.73_m2} (ref >60)
Glucose: 91 mg/dL (ref 74–99)
Potassium: 3.1 mmol/L — ABNORMAL LOW (ref 3.5–5.5)
Sodium: 141 mmol/L (ref 136–145)

## 2018-01-12 LAB — CBC W/O DIFF
HCT: 27.8 % — ABNORMAL LOW (ref 35.0–45.0)
HGB: 9.1 g/dL — ABNORMAL LOW (ref 12.0–16.0)
MCH: 28 PG (ref 24.0–34.0)
MCHC: 32.7 g/dL (ref 31.0–37.0)
MCV: 85.5 FL (ref 74.0–97.0)
PLATELET: 90 10*3/uL — ABNORMAL LOW (ref 135–420)
RBC: 3.25 M/uL — ABNORMAL LOW (ref 4.20–5.30)
RDW: 15.8 % — ABNORMAL HIGH (ref 11.6–14.5)
WBC: 12.4 10*3/uL (ref 4.6–13.2)

## 2018-01-12 LAB — HGB & HCT
HCT: 27.9 % — ABNORMAL LOW (ref 35.0–45.0)
HGB: 9.1 g/dL — ABNORMAL LOW (ref 12.0–16.0)

## 2018-01-12 LAB — CARDIAC PANEL,(CK, CKMB & TROPONIN)
CK - MB: 6.1 ng/ml — ABNORMAL HIGH (ref <3.6)
CK-MB Index: 5.2 % — ABNORMAL HIGH (ref 0.0–4.0)
CK: 117 U/L (ref 26–192)
Troponin-I, QT: 2.2 NG/ML — CR (ref 0.0–0.045)

## 2018-01-12 LAB — TROPONIN I: Troponin-I, QT: 2.56 NG/ML — CR (ref 0.0–0.045)

## 2018-01-12 LAB — BASIC METABOLIC PANEL
Anion Gap: 6 mmol/L (ref 3.0–18)
BUN: 13 MG/DL (ref 7.0–18)
Bun/Cre Ratio: 19 (ref 12–20)
CO2: 30 mmol/L (ref 21–32)
Calcium: 8.4 MG/DL — ABNORMAL LOW (ref 8.5–10.1)
Chloride: 105 mmol/L (ref 100–108)
Creatinine: 0.67 MG/DL (ref 0.6–1.3)
EGFR IF NonAfrican American: >60 mL/min/{1.73_m2} (ref >60)
GFR African American: >60 mL/min/{1.73_m2} (ref >60)
Glucose: 91 mg/dL (ref 74–99)
Potassium: 3.1 mmol/L — ABNORMAL LOW (ref 3.5–5.5)
Sodium: 141 mmol/L (ref 136–145)

## 2018-01-12 LAB — HEMOGLOBIN AND HEMATOCRIT
Hematocrit: 27.9 % — ABNORMAL LOW (ref 35.0–45.0)
Hemoglobin: 9.1 g/dL — ABNORMAL LOW (ref 12.0–16.0)

## 2018-01-12 LAB — CARDIAC PANEL
CK-MB Index: 5.2 % — ABNORMAL HIGH (ref 0.0–4.0)
CK-MB: 6.1 ng/ml — ABNORMAL HIGH (ref <3.6)
Total CK: 117 U/L (ref 26–192)
Troponin I: 2.2 NG/ML (ref 0.0–0.045)

## 2018-01-12 LAB — CBC
Hematocrit: 27.8 % — ABNORMAL LOW (ref 35.0–45.0)
Hemoglobin: 9.1 g/dL — ABNORMAL LOW (ref 12.0–16.0)
MCH: 28 PG (ref 24.0–34.0)
MCHC: 32.7 g/dL (ref 31.0–37.0)
MCV: 85.5 FL (ref 74.0–97.0)
Platelets: 90 10*3/uL — ABNORMAL LOW (ref 135–420)
RBC: 3.25 M/uL — ABNORMAL LOW (ref 4.20–5.30)
RDW: 15.8 % — ABNORMAL HIGH (ref 11.6–14.5)
WBC: 12.4 10*3/uL (ref 4.6–13.2)

## 2018-01-12 LAB — TROPONIN: Troponin I: 2.56 NG/ML (ref 0.0–0.045)

## 2018-01-12 MED ORDER — POTASSIUM CHLORIDE SR 20 MEQ TAB, PARTICLES/CRYSTALS
20 mEq | ORAL | Status: AC
Start: 2018-01-12 — End: 2018-01-12
  Administered 2018-01-12: 18:00:00 via ORAL

## 2018-01-12 MED ORDER — METOPROLOL SUCCINATE SR 25 MG 24 HR TAB
25 mg | Freq: Every day | ORAL | Status: DC
Start: 2018-01-12 — End: 2018-01-13
  Administered 2018-01-12 – 2018-01-13 (×2): via ORAL

## 2018-01-12 MED ORDER — POTASSIUM CHLORIDE 20 MEQ ORAL PACKET FOR SOLUTION
20 mEq | ORAL | Status: AC
Start: 2018-01-12 — End: 2018-01-12
  Administered 2018-01-12: 19:00:00 via ORAL

## 2018-01-12 MED FILL — VERAPAMIL 80 MG TAB: 80 mg | ORAL | Qty: 2

## 2018-01-12 MED FILL — ATORVASTATIN 40 MG TAB: 40 mg | ORAL | Qty: 2

## 2018-01-12 MED FILL — LOVENOX 60 MG/0.6 ML SUBCUTANEOUS SYRINGE: 60 mg/0.6 mL | SUBCUTANEOUS | Qty: 0.6

## 2018-01-12 MED FILL — ACETAMINOPHEN 325 MG TABLET: 325 mg | ORAL | Qty: 2

## 2018-01-12 MED FILL — CHILDREN'S ASPIRIN 81 MG CHEWABLE TABLET: 81 mg | ORAL | Qty: 1

## 2018-01-12 MED FILL — SENNA-S 8.6 MG-50 MG TABLET: ORAL | Qty: 1

## 2018-01-12 MED FILL — POTASSIUM CHLORIDE SR 20 MEQ TAB, PARTICLES/CRYSTALS: 20 mEq | ORAL | Qty: 2

## 2018-01-12 MED FILL — METOPROLOL SUCCINATE SR 25 MG 24 HR TAB: 25 mg | ORAL | Qty: 1

## 2018-01-12 MED FILL — POLYETHYLENE GLYCOL 3350 17 GRAM (100 %) ORAL POWDER PACKET: 17 gram | ORAL | Qty: 1

## 2018-01-12 MED FILL — POTASSIUM CHLORIDE 20 MEQ ORAL PACKET FOR SOLUTION: 20 mEq | ORAL | Qty: 1

## 2018-01-12 NOTE — Progress Notes (Signed)
Problem: Risk for Spread of Infection  Goal: Prevent transmission of infectious organism to others  Description  Prevent the transmission of infectious organisms to other patients, staff members, and visitors.  Outcome: Progressing Towards Goal     Problem: Patient Education:  Go to Education Activity  Goal: Patient/Family Education  Outcome: Progressing Towards Goal     Problem: Falls - Risk of  Goal: *Absence of Falls  Description  Document Bridgette HabermannSchmid Fall Risk and appropriate interventions in the flowsheet.  Outcome: Progressing Towards Goal     Problem: Patient Education: Go to Patient Education Activity  Goal: Patient/Family Education  Outcome: Progressing Towards Goal     Problem: Pain  Goal: *Control of Pain  Outcome: Progressing Towards Goal  Goal: *PALLIATIVE CARE:  Alleviation of Pain  Outcome: Progressing Towards Goal     Problem: Patient Education: Go to Patient Education Activity  Goal: Patient/Family Education  Outcome: Progressing Towards Goal     Problem: Patient Education: Go to Patient Education Activity  Goal: Patient/Family Education  Outcome: Progressing Towards Goal     Problem: Unstable angina/NSTEMI: Day of Admission/Day 1  Goal: Off Pathway (Use only if patient is Off Pathway)  Outcome: Progressing Towards Goal  Goal: Activity/Safety  Outcome: Progressing Towards Goal  Goal: Consults, if ordered  Outcome: Progressing Towards Goal  Goal: Diagnostic Test/Procedures  Outcome: Progressing Towards Goal  Goal: Nutrition/Diet  Outcome: Progressing Towards Goal  Goal: Discharge Planning  Outcome: Progressing Towards Goal  Goal: Medications  Outcome: Progressing Towards Goal  Goal: Respiratory  Outcome: Progressing Towards Goal  Goal: Treatments/Interventions/Procedures  Outcome: Progressing Towards Goal  Goal: Psychosocial  Outcome: Progressing Towards Goal  Goal: *Hemodynamically stable  Outcome: Progressing Towards Goal  Goal: *Optimal pain control at patient's stated goal   Outcome: Progressing Towards Goal  Goal: *Lungs clear or at baseline  Outcome: Progressing Towards Goal     Problem: Unstable angina/NSTEMI: Day of Admission/Day 1  Goal: Off Pathway (Use only if patient is Off Pathway)  Outcome: Progressing Towards Goal  Goal: Activity/Safety  Outcome: Progressing Towards Goal  Goal: Consults, if ordered  Outcome: Progressing Towards Goal  Goal: Diagnostic Test/Procedures  Outcome: Progressing Towards Goal  Goal: Nutrition/Diet  Outcome: Progressing Towards Goal  Goal: Discharge Planning  Outcome: Progressing Towards Goal  Goal: Medications  Outcome: Progressing Towards Goal  Goal: Respiratory  Outcome: Progressing Towards Goal  Goal: Treatments/Interventions/Procedures  Outcome: Progressing Towards Goal  Goal: Psychosocial  Outcome: Progressing Towards Goal  Goal: *Hemodynamically stable  Outcome: Progressing Towards Goal  Goal: *Optimal pain control at patient's stated goal  Outcome: Progressing Towards Goal  Goal: *Lungs clear or at baseline  Outcome: Progressing Towards Goal

## 2018-01-12 NOTE — Progress Notes (Signed)
[id number]: Bedside shift change report given to Tobi RN (oncoming nurse) by Adelina MingsKelsey RN (offgoing nurse). Report included the following information SBAR, Kardex, Procedure Summary, Intake/Output, MAR and Cardiac Rhythm SR 1st AV.     0835: Spoke with Mardella LaymanLindsey PA about the patient diet status. NPO, but not seeing in notes for reasoning. She is reviewing and will place in order is no factors seen.     Spoke with RoystonLindsey PA about seeing blood on the chuck. She came up to floor to assess. Blot Clot noted.    Patient up to assist to the bathroom. Patient had a BM, noted blood when wiping her. Assessed rectum, external Hemorrhoid noted. Mardella LaymanLindsey PA aware.    Patient had difficulty swallowing potassium tablets, notified Mardella LaymanLindsey PA about swallowing an BP med with bp of 114/79, she stated okay to give medications and will switch potassium to packet.     1915: Bedside shift change report given to Adelina MingsKelsey RN (oncoming nurse) by Carney Livingobi RN (offgoing nurse). Report included the following information SBAR, Kardex, Procedure Summary, Intake/Output, MAR and Cardiac Rhythm SR with 1st AV.

## 2018-01-12 NOTE — Progress Notes (Signed)
Problem: Risk for Spread of Infection  Goal: Prevent transmission of infectious organism to others  Description  Prevent the transmission of infectious organisms to other patients, staff members, and visitors.  Outcome: Progressing Towards Goal     Problem: Patient Education:  Go to Education Activity  Goal: Patient/Family Education  Outcome: Progressing Towards Goal     Problem: Falls - Risk of  Goal: *Absence of Falls  Description  Document Jennifer Holder Fall Risk and appropriate interventions in the flowsheet.  Outcome: Progressing Towards Goal     Problem: Patient Education: Go to Patient Education Activity  Goal: Patient/Family Education  Outcome: Progressing Towards Goal     Problem: Pain  Goal: *Control of Pain  Outcome: Progressing Towards Goal  Goal: *PALLIATIVE CARE:  Alleviation of Pain  Outcome: Progressing Towards Goal     Problem: Patient Education: Go to Patient Education Activity  Goal: Patient/Family Education  Outcome: Progressing Towards Goal     Problem: Patient Education: Go to Patient Education Activity  Goal: Patient/Family Education  Outcome: Progressing Towards Goal     Problem: Unstable angina/NSTEMI: Day of Admission/Day 1  Goal: Off Pathway (Use only if patient is Off Pathway)  Outcome: Progressing Towards Goal  Goal: Activity/Safety  Outcome: Progressing Towards Goal  Goal: Consults, if ordered  Outcome: Progressing Towards Goal  Goal: Diagnostic Test/Procedures  Outcome: Progressing Towards Goal  Goal: Nutrition/Diet  Outcome: Progressing Towards Goal  Goal: Discharge Planning  Outcome: Progressing Towards Goal  Goal: Medications  Outcome: Progressing Towards Goal  Goal: Respiratory  Outcome: Progressing Towards Goal  Goal: Treatments/Interventions/Procedures  Outcome: Progressing Towards Goal  Goal: Psychosocial  Outcome: Progressing Towards Goal  Goal: *Hemodynamically stable  Outcome: Progressing Towards Goal  Goal: *Optimal pain control at patient's stated goal  Outcome: Progressing  Towards Goal  Goal: *Lungs clear or at baseline  Outcome: Progressing Towards Goal     Problem: Unstable angina/NSTEMI: Day of Admission/Day 1  Goal: Off Pathway (Use only if patient is Off Pathway)  Outcome: Progressing Towards Goal  Goal: Activity/Safety  Outcome: Progressing Towards Goal  Goal: Consults, if ordered  Outcome: Progressing Towards Goal  Goal: Diagnostic Test/Procedures  Outcome: Progressing Towards Goal  Goal: Nutrition/Diet  Outcome: Progressing Towards Goal  Goal: Discharge Planning  Outcome: Progressing Towards Goal  Goal: Medications  Outcome: Progressing Towards Goal  Goal: Respiratory  Outcome: Progressing Towards Goal  Goal: Treatments/Interventions/Procedures  Outcome: Progressing Towards Goal  Goal: Psychosocial  Outcome: Progressing Towards Goal  Goal: *Hemodynamically stable  Outcome: Progressing Towards Goal  Goal: *Optimal pain control at patient's stated goal  Outcome: Progressing Towards Goal  Goal: *Lungs clear or at baseline  Outcome: Progressing Towards Goal

## 2018-01-12 NOTE — Progress Notes (Signed)
 Assumed care of pt. Pt resting in bed. AxOx4. No c/o pain. NAD. Call light within reach. Will continue to monitor.     0025- pt gotten up to go to the bathroom. Brief taken off. Noted moderate amount of blood in brief and a clot the size of a squash ball, a little smaller than a golf ball.    9585- pt assisted to bathroom and assisted back to bed. Pt tolerated well.     No issues or pain noted throughout night. Pt slept comfortably throughout night.    Bedside shift change report given to Tobi, Charity fundraiser ([employer]abin [employer]) by Larraine, RN (offgoing nurse). Report included the following information SBAR, Kardex, Intake/Output, MAR, Accordion, Recent Results and Cardiac Rhythm NSR with 1st degree block.

## 2018-01-12 NOTE — Progress Notes (Signed)
Problem: Risk for Spread of Infection  Goal: Prevent transmission of infectious organism to others  Description  Prevent the transmission of infectious organisms to other patients, staff members, and visitors.  Outcome: Progressing Towards Goal     Problem: Patient Education:  Go to Education Activity  Goal: Patient/Family Education  Outcome: Progressing Towards Goal     Problem: Falls - Risk of  Goal: *Absence of Falls  Description  Document Jennifer Holder Fall Risk and appropriate interventions in the flowsheet.  Outcome: Progressing Towards Goal     Problem: Patient Education: Go to Patient Education Activity  Goal: Patient/Family Education  Outcome: Progressing Towards Goal     Problem: Pain  Goal: *Control of Pain  Outcome: Progressing Towards Goal  Goal: *PALLIATIVE CARE:  Alleviation of Pain  Outcome: Progressing Towards Goal     Problem: Patient Education: Go to Patient Education Activity  Goal: Patient/Family Education  Outcome: Progressing Towards Goal     Problem: Patient Education: Go to Patient Education Activity  Goal: Patient/Family Education  Outcome: Progressing Towards Goal     Problem: Unstable angina/NSTEMI: Day of Admission/Day 1  Goal: Off Pathway (Use only if patient is Off Pathway)  Outcome: Progressing Towards Goal  Goal: Activity/Safety  Outcome: Progressing Towards Goal  Goal: Consults, if ordered  Outcome: Progressing Towards Goal  Goal: Diagnostic Test/Procedures  Outcome: Progressing Towards Goal  Goal: Nutrition/Diet  Outcome: Progressing Towards Goal  Goal: Discharge Planning  Outcome: Progressing Towards Goal  Goal: Medications  Outcome: Progressing Towards Goal  Goal: Respiratory  Outcome: Progressing Towards Goal  Goal: Treatments/Interventions/Procedures  Outcome: Progressing Towards Goal  Goal: Psychosocial  Outcome: Progressing Towards Goal  Goal: *Hemodynamically stable  Outcome: Progressing Towards Goal  Goal: *Optimal pain control at patient's stated goal  Outcome: Progressing  Towards Goal  Goal: *Lungs clear or at baseline  Outcome: Progressing Towards Goal     Problem: Constipation - Risk of  Goal: *Prevention of constipation  Outcome: Progressing Towards Goal  Goal: *PALLIATIVE CARE:  Prevention/Alleviation of constipation  Outcome: Progressing Towards Goal     Problem: Patient Education: Go to Patient Education Activity  Goal: Patient/Family Education  Outcome: Progressing Towards Goal     Problem: Pressure Injury - Risk of  Goal: *Prevention of pressure injury  Description  Document Braden Scale and appropriate interventions in the flowsheet.  Outcome: Progressing Towards Goal     Problem: Patient Education: Go to Patient Education Activity  Goal: Patient/Family Education  Outcome: Progressing Towards Goal     Problem: Discharge Planning  Goal: *Discharge to safe environment  Outcome: Progressing Towards Goal

## 2018-01-12 NOTE — Progress Notes (Signed)
Progress Notes by Roel Cluck, PA at 01/12/18 1134                Author: Roel Cluck, PA  Service: Physician Assistant  Author Type: Physician Assistant       Filed: 01/12/18 1141  Date of Service: 01/12/18 1134  Status: Attested           Editor: Roel Cluck, PA (Physician Assistant)  Cosigner: Gevena Barre, MD at 01/12/18 2049          Attestation signed by Gevena Barre, MD at 01/12/18 2049          I have seen patient independently and I have discussed management with PA Markees Carns?? We are working on plan collaboratively and I agree with the progress  note written by PA Earnie Bechard                                             Internal Medicine Progress Note      Patient's Name: Jennifer Holder   Admit Date: 01/11/2018   Length of Stay: 1           Assessment/Plan        Principal Problem:     NSTEMI (non-ST elevated myocardial infarction) (HCC) (01/11/2018)      Active Problems:     Constipation (01/11/2018)        Vaginal bleeding (01/12/2018)        Hypokalemia (01/12/2018)      NSTEMI   - cardiology consulted - appreciate   - troponin trending down   - echo   - therapeutic lovenox d/c'd   - asa/statin   ??   Constipation   - daily miralax, pericolace   - verapamil changed to BB      Vaginal bleeding   - d/c'd lovenox   - will need GYN f/u for endometrial bx   - monitor hgb overnight, has decreased from 11 -> 9 in 24 hours      Hypokalemia   - replace, monitor      - Cont acceptable home medications for chronic conditions    - DVT protocol      I have personally reviewed all pertinent labs and films that have officially resulted over the last 24 hours. I have personally checked for all pending labs that are awaiting final  results.        Interval History        Per H&P, "Jennifer Holder is a 84 y.o. female who presented to the ED with c/o abd pain. Patient's daughter states she took mag citrate yesterday and had a large, mostly formed  stool last night. She was concerned because the patient  was still having abd pain today. In the ED, abd CT showed large stool burden in large intestine and possible fecal impaction, no bowel obstruction. Patient on verapamil which may be contributing  to constipation. Incidental finding on labs included troponin of 3.01. No changes on EKG. Cardiology consulted from ED. Patient will be admitted for further evaluation."      Troponin trending down. Cardiology changed verapamil to BB. Patient has had multiple BMs and abdominal pain has resolved, but has developed vaginal bleeding. Therapeutic lovenox d/c'd. Pelvic organs unremarkable on initial CT. Patient will need GYN f/u  for likely endometrial bx.  Subjective        Pt s/e @ bedside. No major events overnight. Pt now with vaginal bleeding. Denies CP or SOB. Denies abd pain. +BMs        Objective        Visit Vitals      BP  116/63     Pulse  72     Temp  97.9 ??F (36.6 ??C)     Resp  16     Ht  5\' 7"  (1.702 m)     Wt  52.6 kg (116 lb)     SpO2  97%        BMI  18.17 kg/m??           Physical Exam:   General Appearance: NAD, conversant   HENT: normocephalic/atraumatic, moist mucus membranes   Neck: No JVD, supple   Lungs: CTA with normal respiratory effort   CV: RRR, no m/r/g   Abdomen: soft, non-tender, normal bowel sounds   Gyn: vaginal bleeding   Extremities: no cyanosis, no peripheral edema   Neuro: No focal deficits, motor/sensory intact   Skin: Normal color, intact         Intake and Output:   Current Shift:  No intake/output data recorded.   Last three shifts:  07/02 1901 - 07/04 0700   In: 2298 [P.O.:120; I.V.:2178]   Out: -       Lab/Data Reviewed:   BMP:      Lab Results         Component  Value  Date/Time            NA  141  01/12/2018 03:40 AM       K  3.1 (L)  01/12/2018 03:40 AM       CL  105  01/12/2018 03:40 AM       CO2  30  01/12/2018 03:40 AM       AGAP  6  01/12/2018 03:40 AM       GLU  91  01/12/2018 03:40 AM       BUN  13  01/12/2018 03:40 AM       CREA  0.67  01/12/2018 03:40 AM       GFRAA  >60   01/12/2018 03:40 AM            GFRNA  >60  01/12/2018 03:40 AM        CBC:      Lab Results         Component  Value  Date/Time            WBC  12.4  01/12/2018 03:40 AM       HGB  9.1 (L)  01/12/2018 03:40 AM       HCT  27.8 (L)  01/12/2018 03:40 AM            PLT  90 (L)  01/12/2018 03:40 AM           Imaging Reviewed:   No results found.      Medications Reviewed:     Current Facility-Administered Medications          Medication  Dose  Route  Frequency           ?  metoprolol succinate (TOPROL-XL) XL tablet 12.5 mg   12.5 mg  Oral  DAILY     ?  potassium chloride SR (K-TAB) tablet 40 mEq   40 mEq  Oral  NOW     ?  sodium chloride (NS) flush 5-10 mL   5-10 mL  IntraVENous  PRN     ?  aspirin chewable tablet 81 mg   81 mg  Oral  DAILY     ?  atorvastatin (LIPITOR) tablet 80 mg   80 mg  Oral  QHS     ?  polyethylene glycol (MIRALAX) packet 17 g   17 g  Oral  DAILY     ?  senna-docusate (PERICOLACE) 8.6-50 mg per tablet 1 Tab   1 Tab  Oral  DAILY           ?  bisacodyl (DULCOLAX) suppository 10 mg   10 mg  Rectal  DAILY PRN           ?  acetaminophen (TYLENOL) tablet 650 mg   650 mg  Oral  Q4H PRN           ?  ondansetron (ZOFRAN) injection 4 mg   4 mg  IntraVENous  Q4H PRN              Ulyess BlossomLindsey Rosco Harriott, PA-C   [facility]   Hospitalist Division   Office:  [phone]   Pager: [phone]

## 2018-01-12 NOTE — Progress Notes (Signed)
[facility] -progress note    Date of  Admission: 01/11/2018  8:06 AM   Primary Care Physician:  Melina Copa, NP     Assessment:     Patient Active Problem List   Diagnosis Code   ??? NSTEMI (non-ST elevated myocardial infarction), serum troponin level falling.  No chest pain or shortness of breath flat in bed.  No arrhythmia noted (HCC).  I21.4   ??? Constipation, possibly aggravated by verapamil.  Will switch to low-dose beta-blocker particularly in light of recent coronary syndrome. K59.00     - Elevated initial troponin 3.01, no chest pain or acute EKG changes, down to 2.20 this morning- Presented with fatigue, lethargy, diarrhea after taking Mg Citrate due to previous constipation. CT ABD pelvis with fecal impaction, no bowel obstruction  - Elevated WBC's       Plan:     - Follow cardiac enzymes   - Check echocardiogram  - Therapeutic Lovenox ordered for elevated Troponin  - Discussed with patient elevated Troponin, will follow enzyme trend for recommendation of further cardiac workup, medical management for now with ASA, statin and anticoagulation with Lovenox.     History of Present Illness:     This is a 84 y.o. female admitted for NSTEMI (non-ST elevated myocardial infarction) (HCC) [I21.4].    Patient complains of:  Presented with fatigue, weakness     This is an 84 year old female without a significant past medical history that cardiology is seeing in consult due to elevated Troponin. She explains that she felt tired and weak over the last several days, was constipated initially but then took Mg citrate and began having frequent bowel movements/diarrhea. She denies chest pain or shortness of breath. Has not had any orthopnea, PND, LE edema or palpitations. Denies cardiac history of CAD, CHF or previous stenting.       Cardiac risk factors: post-menopausal      Review of Symptoms:  Except as stated above include:  Constitutional:  negative  Respiratory:  negative  Cardiovascular:   negative  Gastrointestinal: negative  Genitourinary:  negative  Musculoskeletal:  Negative  Neurological:  Negative  Dermatological:  Negative  Endocrinological: Negative  Psychological:  Negative    A comprehensive review of systems was negative except for that written in the HPI.     Past Medical History:     Past Medical History:   Diagnosis Date   ??? Scoliosis          Social History:     Social History     Socioeconomic History   ??? Marital status: WIDOWED     Spouse name: Not on file   ??? Number of children: Not on file   ??? Years of education: Not on file   ??? Highest education level: Not on file   Tobacco Use   ??? Smoking status: Former Smoker     Years: 10.00   ??? Smokeless tobacco: Never Used   Substance and Sexual Activity   ??? Alcohol use: Yes     Comment: coasionally   ??? Drug use: No        Family History:   No family history on file.     Medications:   No Known Allergies     Current Facility-Administered Medications   Medication Dose Route Frequency   ??? sodium chloride (NS) flush 5-10 mL  5-10 mL IntraVENous PRN   ??? verapamil (CALAN) tablet 120 mg  120 mg Oral TID   ??? 0.9%  sodium chloride infusion  50 mL/hr IntraVENous CONTINUOUS   ??? enoxaparin (LOVENOX) injection 50 mg  1 mg/kg SubCUTAneous Q12H   ??? aspirin chewable tablet 81 mg  81 mg Oral DAILY   ??? atorvastatin (LIPITOR) tablet 80 mg  80 mg Oral QHS   ??? polyethylene glycol (MIRALAX) packet 17 g  17 g Oral DAILY   ??? senna-docusate (PERICOLACE) 8.6-50 mg per tablet 1 Tab  1 Tab Oral DAILY   ??? bisacodyl (DULCOLAX) suppository 10 mg  10 mg Rectal DAILY PRN   ??? acetaminophen (TYLENOL) tablet 650 mg  650 mg Oral Q4H PRN   ??? ondansetron (ZOFRAN) injection 4 mg  4 mg IntraVENous Q4H PRN         Physical Exam:     Visit Vitals  BP 116/63   Pulse 72   Temp 97.9 ??F (36.6 ??C)   Resp 16   Ht 5\' 7"  (1.702 m)   Wt 116 lb (52.6 kg)   SpO2 97%   BMI 18.17 kg/m??     BP Readings from Last 3 Encounters:   01/12/18 116/63   06/17/16 118/60     Pulse Readings from Last 3  Encounters:   01/12/18 72     Wt Readings from Last 3 Encounters:   01/12/18 116 lb (52.6 kg)   06/17/16 100 lb (45.4 kg)       General:  alert, cooperative, no distress  Neck:  nontender, no JVD  Lungs:  clear to auscultation bilaterally  Heart:  regular rate and rhythm, S1, S2 normal, no murmur, click, rub or gallop  Abdomen:  abdomen is soft without significant tenderness, masses, organomegaly or guarding  Extremities:  extremities normal, atraumatic, no cyanosis or edema  Skin: Warm and dry. no hyperpigmentation, vitiligo, or suspicious lesions  Neuro: alert, oriented x3, affect appropriate  Psych: non focal     Data Review:     Recent Labs     01/12/18  0340 01/11/18  0827   WBC 12.4 16.0*   HGB 9.1* 11.2*   HCT 27.8* 33.6*   PLT 90* 113*     Recent Labs     01/12/18  0340 01/11/18  0827   NA 141 138   K 3.1* 3.8   CL 105 100   CO2 30 33*   GLU 91 118*   BUN 13 17   CREA 0.67 0.92   CA 8.4* 9.2   MG  --  2.6   ALB  --  4.0   SGOT  --  37   ALT  --  19       Results for orders placed or performed during the hospital encounter of 01/11/18   EKG, 12 LEAD, INITIAL   Result Value Ref Range    Ventricular Rate 99 BPM    Atrial Rate 99 BPM    P-R Interval 194 ms    QRS Duration 84 ms    Q-T Interval 408 ms    QTC Calculation (Bezet) 523 ms    Calculated P Axis 65 degrees    Calculated R Axis -16 degrees    Calculated T Axis 52 degrees    Diagnosis       Sinus rhythm with occasional premature ventricular complexes with 1st degree   AV block  Nonspecific ST and T wave abnormality  Septal infarct , age undetermined Possible  Prolonged QT  Abnormal ECG  No previous ECGs available  Confirmed by Merdis Delayallaghan, [place] Rommel ([id number]) on 01/11/2018 11:15:42  AM         All Cardiac Markers in the last 24 hours:    Lab Results   Component Value Date/Time    CPK 117 01/12/2018 03:40 AM    CKMB 6.1 (H) 01/12/2018 03:40 AM    CKND1 5.2 (H) 01/12/2018 03:40 AM    TROIQ 2.20 (HH) 01/12/2018 03:40 AM    TROIQ 2.56 (HH) 01/11/2018 11:00 PM    TROIQ  3.04 (HH) 01/11/2018 05:19 PM       Last Lipid:  No results found for: CHOL, CHOLX, CHLST, CHOLV, HDL, LDL, LDLC, DLDLP, TGLX, TRIGL, TRIGP, CHHD, CHHDX    Signed By: Merdis Delay, MD     January 12, 2018

## 2018-01-12 NOTE — Progress Notes (Signed)
Problem: Discharge Planning  Goal: *Discharge to safe environment  Outcome: Progressing Towards Goal          Home, possible home health     Reason for Admission:   NSTEMI                  RRAT Score:  13                Do you (patient/family) have any concerns for transition/discharge?    Not @ this time               Plan for utilizing home health: Qualifies for [facility], need order.       Current Advanced Directive/Advance Care Plan:  Yes, not on file            Transition of Care Plan:   Home with family support, ?HH, & out-pt follow up when medically stable.      Chart reviewed.  Met with pt., verified all demographics.  States has [place].  States gets her medical follow up with Debbra Riding Doss,NP, last seen about 1 year ago, has been going to all her specialists.  NOK:  Awilda Bill, dtr.  Pt lives in apartment over garage @ her daughters home.  Independent with ADL's prior to admit.  Has cane.  Will cont to follow.  Pat Rizzo,RN,ext [phone].     Patient has designated her daughter to participate in his/her discharge plan and to receive any needed information.     Name: Jennifer Holder  Address:  Phone number:  [phone]    Care Management Interventions  PCP Verified by CM: Yes(Sue Whitehurst-Doss,NP, last seen about 1 year ago)  Palliative Care Criteria Met (RRAT>21 & CHF Dx)?: No  Mode of Transport at Discharge: Other (see comment)(family)  Transition of Care Consult (CM Consult): Discharge Planning  Discharge Durable Medical Equipment: No  Physical Therapy Consult: No  Occupational Therapy Consult: No  Speech Therapy Consult: No  Current Support Network: Lives Alone(in apartment over [place] of daughter's home)  Confirm Follow Up Transport: Family  Plan discussed with Pt/Family/Caregiver: Yes  Discharge Location  Discharge Placement: Home

## 2018-01-12 NOTE — Progress Notes (Signed)
0727: Bedside shift change report given to Tobi RN (oncoming nurse) by Kelsey RN (offgoing nurse). Report included the following information SBAR, Kardex, Procedure Summary, Intake/Output, MAR and Cardiac Rhythm SR 1st AV.     0835: Spoke with Lindsey PA about the patient diet status. NPO, but not seeing in notes for reasoning. She is reviewing and will place in order is no factors seen.     Spoke with Lindsey PA about seeing blood on the chuck. She came up to floor to assess. Blot Clot noted.    Patient up to assist to the bathroom. Patient had a BM, noted blood when wiping her. Assessed rectum, external Hemorrhoid noted. Lindsey PA aware.    Patient had difficulty swallowing potassium tablets, notified Lindsey PA about swallowing an BP med with bp of 114/79, she stated okay to give medications and will switch potassium to packet.     1915: Bedside shift change report given to Kelsey RN (oncoming nurse) by Tobi RN (offgoing nurse). Report included the following information SBAR, Kardex, Procedure Summary, Intake/Output, MAR and Cardiac Rhythm SR with 1st AV.

## 2018-01-12 NOTE — Progress Notes (Addendum)
Problem: Discharge Planning  Goal: *Discharge to safe environment  Outcome: Progressing Towards Goal          Home, possible home health     Reason for Admission:   NSTEMI                  RRAT Score:  13                Do you (patient/family) have any concerns for transition/discharge?    Not @ this time               Plan for utilizing home health: Qualifies for [facility], need order.       Current Advanced Directive/Advance Care Plan:  Yes, not on file            Transition of Care Plan:   Home with family support, ?HH, & out-pt follow up when medically stable.      Chart reviewed.  Met with pt., verified all demographics.  States has M[employer] & ?AARP.  States gets her medical follow up with Sue Whitehurst Doss,NP, last seen about 1 year ago, has been going to all her specialists.  NOK:  Jennifer Holder, dtr.  Pt lives in apartment over garage @ her daughters home.  Independent with ADL's prior to admit.  Has cane.  Will cont to follow.  Pat Rizzo,RN,ext [phone].     Patient has designated her daughter to participate in his/her discharge plan and to receive any needed information.     Name: Jennifer Holder  Address:  Phone number:  [phone]    Care Management Interventions  PCP Verified by CM: Yes(Sue Whitehurst-Doss,NP, last seen about 1 year ago)  Palliative Care Criteria Met (RRAT>21 & CHF Dx)?: No  Mode of Transport at Discharge: Other (see comment)(family)  Transition of Care Consult (CM Consult): Discharge Planning  Discharge Durable Medical Equipment: No  Physical Therapy Consult: No  Occupational Therapy Consult: No  Speech Therapy Consult: No  Current Support Network: Lives Alone(in apartment over Garage of daughter's home)  Confirm Follow Up Transport: Family  Plan discussed with Pt/Family/Caregiver: Yes  Discharge Location  Discharge Placement: Home

## 2018-01-12 NOTE — Progress Notes (Signed)
Problem: Risk for Spread of Infection  Goal: Prevent transmission of infectious organism to others  Description  Prevent the transmission of infectious organisms to other patients, staff members, and visitors.  Outcome: Progressing Towards Goal     Problem: Patient Education:  Go to Education Activity  Goal: Patient/Family Education  Outcome: Progressing Towards Goal     Problem: Falls - Risk of  Goal: *Absence of Falls  Description  Document Bridgette HabermannSchmid Fall Risk and appropriate interventions in the flowsheet.  Outcome: Progressing Towards Goal     Problem: Patient Education: Go to Patient Education Activity  Goal: Patient/Family Education  Outcome: Progressing Towards Goal     Problem: Pain  Goal: *Control of Pain  Outcome: Progressing Towards Goal  Goal: *PALLIATIVE CARE:  Alleviation of Pain  Outcome: Progressing Towards Goal     Problem: Patient Education: Go to Patient Education Activity  Goal: Patient/Family Education  Outcome: Progressing Towards Goal     Problem: Patient Education: Go to Patient Education Activity  Goal: Patient/Family Education  Outcome: Progressing Towards Goal     Problem: Unstable angina/NSTEMI: Day of Admission/Day 1  Goal: Off Pathway (Use only if patient is Off Pathway)  Outcome: Progressing Towards Goal  Goal: Activity/Safety  Outcome: Progressing Towards Goal  Goal: Consults, if ordered  Outcome: Progressing Towards Goal  Goal: Diagnostic Test/Procedures  Outcome: Progressing Towards Goal  Goal: Nutrition/Diet  Outcome: Progressing Towards Goal  Goal: Discharge Planning  Outcome: Progressing Towards Goal  Goal: Medications  Outcome: Progressing Towards Goal  Goal: Respiratory  Outcome: Progressing Towards Goal  Goal: Treatments/Interventions/Procedures  Outcome: Progressing Towards Goal  Goal: Psychosocial  Outcome: Progressing Towards Goal  Goal: *Hemodynamically stable  Outcome: Progressing Towards Goal  Goal: *Optimal pain control at patient's stated goal   Outcome: Progressing Towards Goal  Goal: *Lungs clear or at baseline  Outcome: Progressing Towards Goal     Problem: Constipation - Risk of  Goal: *Prevention of constipation  Outcome: Progressing Towards Goal  Goal: *PALLIATIVE CARE:  Prevention/Alleviation of constipation  Outcome: Progressing Towards Goal     Problem: Patient Education: Go to Patient Education Activity  Goal: Patient/Family Education  Outcome: Progressing Towards Goal     Problem: Pressure Injury - Risk of  Goal: *Prevention of pressure injury  Description  Document Braden Scale and appropriate interventions in the flowsheet.  Outcome: Progressing Towards Goal     Problem: Patient Education: Go to Patient Education Activity  Goal: Patient/Family Education  Outcome: Progressing Towards Goal     Problem: Discharge Planning  Goal: *Discharge to safe environment  Outcome: Progressing Towards Goal

## 2018-01-12 NOTE — Progress Notes (Signed)
[facility] -progress note    Date of  Admission: 01/11/2018  8:06 AM   Primary Care Physician:  Melina Copa, NP     Assessment:     Patient Active Problem List   Diagnosis Code   ??? NSTEMI (non-ST elevated myocardial infarction), serum troponin level falling.  No chest pain or shortness of breath flat in bed.  No arrhythmia noted (HCC).  I21.4   ??? Constipation, possibly aggravated by verapamil.  Will switch to low-dose beta-blocker particularly in light of recent coronary syndrome. K59.00     - Elevated initial troponin 3.01, no chest pain or acute EKG changes, down to 2.20 this morning- Presented with fatigue, lethargy, diarrhea after taking Mg Citrate due to previous constipation. CT ABD pelvis with fecal impaction, no bowel obstruction  - Elevated WBC's       Plan:     - Follow cardiac enzymes   - Check echocardiogram  - Therapeutic Lovenox ordered for elevated Troponin  - Discussed with patient elevated Troponin, will follow enzyme trend for recommendation of further cardiac workup, medical management for now with ASA, statin and anticoagulation with Lovenox.     History of Present Illness:     This is a 84 y.o. female admitted for NSTEMI (non-ST elevated myocardial infarction) (HCC) [I21.4].    Patient complains of:  Presented with fatigue, weakness     This is an 84 year old female without a significant past medical history that cardiology is seeing in consult due to elevated Troponin. She explains that she felt tired and weak over the last several days, was constipated initially but then took Mg citrate and began having frequent bowel movements/diarrhea. She denies chest pain or shortness of breath. Has not had any orthopnea, PND, LE edema or palpitations. Denies cardiac history of CAD, CHF or previous stenting.       Cardiac risk factors: post-menopausal      Review of Symptoms:  Except as stated above include:  Constitutional:  negative  Respiratory:  negative   Cardiovascular:  negative  Gastrointestinal: negative  Genitourinary:  negative  Musculoskeletal:  Negative  Neurological:  Negative  Dermatological:  Negative  Endocrinological: Negative  Psychological:  Negative    A comprehensive review of systems was negative except for that written in the HPI.     Past Medical History:     Past Medical History:   Diagnosis Date   ??? Scoliosis          Social History:     Social History     Socioeconomic History   ??? Marital status: WIDOWED     Spouse name: Not on file   ??? Number of children: Not on file   ??? Years of education: Not on file   ??? Highest education level: Not on file   Tobacco Use   ??? Smoking status: Former Smoker     Years: 10.00   ??? Smokeless tobacco: Never Used   Substance and Sexual Activity   ??? Alcohol use: Yes     Comment: coasionally   ??? Drug use: No        Family History:   No family history on file.     Medications:   No Known Allergies     Current Facility-Administered Medications   Medication Dose Route Frequency   ??? sodium chloride (NS) flush 5-10 mL  5-10 mL IntraVENous PRN   ??? verapamil (CALAN) tablet 120 mg  120 mg Oral TID   ??? 0.9%  sodium chloride infusion  50 mL/hr IntraVENous CONTINUOUS   ??? enoxaparin (LOVENOX) injection 50 mg  1 mg/kg SubCUTAneous Q12H   ??? aspirin chewable tablet 81 mg  81 mg Oral DAILY   ??? atorvastatin (LIPITOR) tablet 80 mg  80 mg Oral QHS   ??? polyethylene glycol (MIRALAX) packet 17 g  17 g Oral DAILY   ??? senna-docusate (PERICOLACE) 8.6-50 mg per tablet 1 Tab  1 Tab Oral DAILY   ??? bisacodyl (DULCOLAX) suppository 10 mg  10 mg Rectal DAILY PRN   ??? acetaminophen (TYLENOL) tablet 650 mg  650 mg Oral Q4H PRN   ??? ondansetron (ZOFRAN) injection 4 mg  4 mg IntraVENous Q4H PRN         Physical Exam:     Visit Vitals  BP 116/63   Pulse 72   Temp 97.9 ??F (36.6 ??C)   Resp 16   Ht 5\' 7"  (1.702 m)   Wt 116 lb (52.6 kg)   SpO2 97%   BMI 18.17 kg/m??     BP Readings from Last 3 Encounters:   01/12/18 116/63   06/17/16 118/60      Pulse Readings from Last 3 Encounters:   01/12/18 72     Wt Readings from Last 3 Encounters:   01/12/18 116 lb (52.6 kg)   06/17/16 100 lb (45.4 kg)       General:  alert, cooperative, no distress  Neck:  nontender, no JVD  Lungs:  clear to auscultation bilaterally  Heart:  regular rate and rhythm, S1, S2 normal, no murmur, click, rub or gallop  Abdomen:  abdomen is soft without significant tenderness, masses, organomegaly or guarding  Extremities:  extremities normal, atraumatic, no cyanosis or edema  Skin: Warm and dry. no hyperpigmentation, vitiligo, or suspicious lesions  Neuro: alert, oriented x3, affect appropriate  Psych: non focal     Data Review:     Recent Labs     01/12/18  0340 01/11/18  0827   WBC 12.4 16.0*   HGB 9.1* 11.2*   HCT 27.8* 33.6*   PLT 90* 113*     Recent Labs     01/12/18  0340 01/11/18  0827   NA 141 138   K 3.1* 3.8   CL 105 100   CO2 30 33*   GLU 91 118*   BUN 13 17   CREA 0.67 0.92   CA 8.4* 9.2   MG  --  2.6   ALB  --  4.0   SGOT  --  37   ALT  --  19       Results for orders placed or performed during the hospital encounter of 01/11/18   EKG, 12 LEAD, INITIAL   Result Value Ref Range    Ventricular Rate 99 BPM    Atrial Rate 99 BPM    P-R Interval 194 ms    QRS Duration 84 ms    Q-T Interval 408 ms    QTC Calculation (Bezet) 523 ms    Calculated P Axis 65 degrees    Calculated R Axis -16 degrees    Calculated T Axis 52 degrees    Diagnosis       Sinus rhythm with occasional premature ventricular complexes with 1st degree   AV block  Nonspecific ST and T wave abnormality  Septal infarct , age undetermined Possible  Prolonged QT  Abnormal ECG  No previous ECGs available  Confirmed by Merdis Delayallaghan, Tykee Heideman ([id number]) on 01/11/2018 11:15:42  AM         All Cardiac Markers in the last 24 hours:    Lab Results   Component Value Date/Time    CPK 117 01/12/2018 03:40 AM    CKMB 6.1 (H) 01/12/2018 03:40 AM    CKND1 5.2 (H) 01/12/2018 03:40 AM    TROIQ 2.20 (HH) 01/12/2018 03:40 AM     TROIQ 2.56 (HH) 01/11/2018 11:00 PM    TROIQ 3.04 (HH) 01/11/2018 05:19 PM       Last Lipid:  No results found for: CHOL, CHOLX, CHLST, CHOLV, HDL, LDL, LDLC, DLDLP, TGLX, TRIGL, TRIGP, CHHD, CHHDX    Signed By: Merdis Delay, MD     January 12, 2018

## 2018-01-12 NOTE — Progress Notes (Signed)
Internal Medicine Progress Note    Patient's Name: Jennifer Holder  Admit Date: 01/11/2018  Length of Stay: 1      Assessment/Plan     Principal Problem:    NSTEMI (non-ST elevated myocardial infarction) (HCC) (01/11/2018)    Active Problems:    Constipation (01/11/2018)      Vaginal bleeding (01/12/2018)      Hypokalemia (01/12/2018)    NSTEMI  - cardiology consulted - appreciate  - troponin trending down  - echo  - therapeutic lovenox d/c'd  - asa/statin  ??  Constipation  - daily miralax, pericolace  - verapamil changed to BB    Vaginal bleeding  - d/c'd lovenox  - will need GYN f/u for endometrial bx  - monitor hgb overnight, has decreased from 11 -> 9 in 24 hours    Hypokalemia  - replace, monitor    - Cont acceptable home medications for chronic conditions   - DVT protocol    I have personally reviewed all pertinent labs and films that have officially resulted over the last 24 hours. I have personally checked for all pending labs that are awaiting final results.    Interval History     Per H&P, "Jennifer Holder is a 84 y.o. female who presented to the ED with c/o abd pain. Patient's daughter states she took mag citrate yesterday and had a large, mostly formed stool last night. She was concerned because the patient was still having abd pain today. In the ED, abd CT showed large stool burden in large intestine and possible fecal impaction, no bowel obstruction. Patient on verapamil which may be contributing to constipation. Incidental finding on labs included troponin of 3.01. No changes on EKG. Cardiology consulted from ED. Patient will be admitted for further evaluation."    Troponin trending down. Cardiology changed verapamil to BB. Patient has had multiple BMs and abdominal pain has resolved, but has developed vaginal bleeding. Therapeutic lovenox d/c'd. Pelvic organs unremarkable on initial CT. Patient will need GYN f/u for likely endometrial bx.    Subjective      Pt s/e @ bedside. No major events overnight. Pt now with vaginal bleeding. Denies CP or SOB. Denies abd pain. +BMs    Objective     Visit Vitals  BP 116/63   Pulse 72   Temp 97.9 ??F (36.6 ??C)   Resp 16   Ht 5\' 7"  (1.702 m)   Wt 52.6 kg (116 lb)   SpO2 97%   BMI 18.17 kg/m??       Physical Exam:  General Appearance: NAD, conversant  HENT: normocephalic/atraumatic, moist mucus membranes  Neck: No JVD, supple  Lungs: CTA with normal respiratory effort  CV: RRR, no m/r/g  Abdomen: soft, non-tender, normal bowel sounds  Gyn: vaginal bleeding  Extremities: no cyanosis, no peripheral edema  Neuro: No focal deficits, motor/sensory intact  Skin: Normal color, intact      Intake and Output:  Current Shift:  No intake/output data recorded.  Last three shifts:  07/02 1901 - 07/04 0700  In: 2298 [P.O.:120; I.V.:2178]  Out: -     Lab/Data Reviewed:  BMP:   Lab Results   Component Value Date/Time    NA 141 01/12/2018 03:40 AM    K 3.1 (L) 01/12/2018 03:40 AM    CL 105 01/12/2018 03:40 AM    CO2 30 01/12/2018 03:40 AM    AGAP 6 01/12/2018 03:40 AM    GLU 91 01/12/2018 03:40 AM    BUN  13 01/12/2018 03:40 AM    CREA 0.67 01/12/2018 03:40 AM    GFRAA >60 01/12/2018 03:40 AM    GFRNA >60 01/12/2018 03:40 AM     CBC:   Lab Results   Component Value Date/Time    WBC 12.4 01/12/2018 03:40 AM    HGB 9.1 (L) 01/12/2018 03:40 AM    HCT 27.8 (L) 01/12/2018 03:40 AM    PLT 90 (L) 01/12/2018 03:40 AM       Imaging Reviewed:  No results found.    Medications Reviewed:  Current Facility-Administered Medications   Medication Dose Route Frequency   ??? metoprolol succinate (TOPROL-XL) XL tablet 12.5 mg  12.5 mg Oral DAILY   ??? potassium chloride SR (K-TAB) tablet 40 mEq  40 mEq Oral NOW   ??? sodium chloride (NS) flush 5-10 mL  5-10 mL IntraVENous PRN   ??? aspirin chewable tablet 81 mg  81 mg Oral DAILY   ??? atorvastatin (LIPITOR) tablet 80 mg  80 mg Oral QHS   ??? polyethylene glycol (MIRALAX) packet 17 g  17 g Oral DAILY    ??? senna-docusate (PERICOLACE) 8.6-50 mg per tablet 1 Tab  1 Tab Oral DAILY   ??? bisacodyl (DULCOLAX) suppository 10 mg  10 mg Rectal DAILY PRN   ??? acetaminophen (TYLENOL) tablet 650 mg  650 mg Oral Q4H PRN   ??? ondansetron (ZOFRAN) injection 4 mg  4 mg IntraVENous Q4H PRN         Ulyess Blossom, PA-C  [facility]  Hospitalist Division  Office:  [phone]  Pager: [phone]

## 2018-01-12 NOTE — Progress Notes (Addendum)
Assumed care of pt. Pt resting in bed. AxOx4. No c/o pain. NAD. Call light within reach. Will continue to monitor.     0025- pt gotten up to go to the bathroom. Brief taken off. Noted moderate amount of blood in brief and a clot the size of a "squash ball," a little smaller than a golf ball.    0414- pt assisted to bathroom and assisted back to bed. Pt tolerated well.     No issues or pain noted throughout night. Pt slept comfortably throughout night.    Bedside shift change report given to Tobi, RN (oncoming nurse) by Kelsey, RN (offgoing nurse). Report included the following information SBAR, Kardex, Intake/Output, MAR, Accordion, Recent Results and Cardiac Rhythm NSR with 1st degree block.

## 2018-01-13 LAB — METABOLIC PANEL, BASIC
Anion gap: 6 mmol/L (ref 3.0–18)
BUN/Creatinine ratio: 24 — ABNORMAL HIGH (ref 12–20)
BUN: 13 MG/DL (ref 7.0–18)
CO2: 26 mmol/L (ref 21–32)
Calcium: 8.3 MG/DL — ABNORMAL LOW (ref 8.5–10.1)
Chloride: 107 mmol/L (ref 100–108)
Creatinine: 0.55 MG/DL — ABNORMAL LOW (ref 0.6–1.3)
GFR est AA: >60 mL/min/{1.73_m2} (ref >60)
GFR est non-AA: >60 mL/min/{1.73_m2} (ref >60)
Glucose: 81 mg/dL (ref 74–99)
Potassium: 3.6 mmol/L (ref 3.5–5.5)
Sodium: 139 mmol/L (ref 136–145)

## 2018-01-13 LAB — CBC W/O DIFF
HCT: 26.4 % — ABNORMAL LOW (ref 35.0–45.0)
HGB: 8.7 g/dL — ABNORMAL LOW (ref 12.0–16.0)
MCH: 27.9 PG (ref 24.0–34.0)
MCHC: 33 g/dL (ref 31.0–37.0)
MCV: 84.6 FL (ref 74.0–97.0)
PLATELET: 83 10*3/uL — ABNORMAL LOW (ref 135–420)
RBC: 3.12 M/uL — ABNORMAL LOW (ref 4.20–5.30)
RDW: 16 % — ABNORMAL HIGH (ref 11.6–14.5)
WBC: 6.8 10*3/uL (ref 4.6–13.2)

## 2018-01-13 LAB — ECHO ADULT COMPLETE: PASP: 46 mmHg

## 2018-01-13 LAB — TROPONIN I: Troponin-I, QT: 0.99 NG/ML — ABNORMAL HIGH (ref 0.0–0.045)

## 2018-01-13 LAB — TRANSTHORACIC ECHOCARDIOGRAM (TTE) COMPLETE (CONTRAST/BUBBLE/3D PRN)
Left Ventricular Ejection Fraction: 48
PASP: 46 mmHg

## 2018-01-13 LAB — CBC
Hematocrit: 26.4 % — ABNORMAL LOW (ref 35.0–45.0)
Hemoglobin: 8.7 g/dL — ABNORMAL LOW (ref 12.0–16.0)
MCH: 27.9 PG (ref 24.0–34.0)
MCHC: 33 g/dL (ref 31.0–37.0)
MCV: 84.6 FL (ref 74.0–97.0)
Platelets: 83 10*3/uL — ABNORMAL LOW (ref 135–420)
RBC: 3.12 M/uL — ABNORMAL LOW (ref 4.20–5.30)
RDW: 16 % — ABNORMAL HIGH (ref 11.6–14.5)
WBC: 6.8 10*3/uL (ref 4.6–13.2)

## 2018-01-13 LAB — BASIC METABOLIC PANEL
Anion Gap: 6 mmol/L (ref 3.0–18)
BUN: 13 MG/DL (ref 7.0–18)
Bun/Cre Ratio: 24 — ABNORMAL HIGH (ref 12–20)
CO2: 26 mmol/L (ref 21–32)
Calcium: 8.3 MG/DL — ABNORMAL LOW (ref 8.5–10.1)
Chloride: 107 mmol/L (ref 100–108)
Creatinine: 0.55 MG/DL — ABNORMAL LOW (ref 0.6–1.3)
EGFR IF NonAfrican American: >60 mL/min/{1.73_m2} (ref >60)
GFR African American: >60 mL/min/{1.73_m2} (ref >60)
Glucose: 81 mg/dL (ref 74–99)
Potassium: 3.6 mmol/L (ref 3.5–5.5)
Sodium: 139 mmol/L (ref 136–145)

## 2018-01-13 LAB — TROPONIN: Troponin I: 0.99 NG/ML — ABNORMAL HIGH (ref 0.0–0.045)

## 2018-01-13 MED ORDER — POLYETHYLENE GLYCOL 3350 17 GRAM (100 %) ORAL POWDER PACKET
17 gram | PACK | Freq: Every day | ORAL | 0 refills | Status: AC
Start: 2018-01-13 — End: ?

## 2018-01-13 MED ORDER — ASPIRIN 81 MG CHEWABLE TAB
81 mg | ORAL_TABLET | Freq: Every day | ORAL | 0 refills | Status: DC
Start: 2018-01-13 — End: 2018-01-13

## 2018-01-13 MED ORDER — POTASSIUM CHLORIDE 20 MEQ ORAL PACKET FOR SOLUTION
20 mEq | ORAL | Status: AC
Start: 2018-01-13 — End: 2018-01-13
  Administered 2018-01-13: 17:00:00 via ORAL

## 2018-01-13 MED ORDER — SENNOSIDES-DOCUSATE SODIUM 8.6 MG-50 MG TAB
ORAL_TABLET | Freq: Every day | ORAL | 0 refills | Status: AC
Start: 2018-01-13 — End: ?

## 2018-01-13 MED ORDER — METOPROLOL SUCCINATE SR 25 MG 24 HR TAB
25 mg | ORAL_TABLET | Freq: Every day | ORAL | 0 refills | Status: AC
Start: 2018-01-13 — End: ?

## 2018-01-13 MED ORDER — ATORVASTATIN 80 MG TAB
80 mg | ORAL_TABLET | Freq: Every evening | ORAL | 0 refills | Status: DC
Start: 2018-01-13 — End: 2018-01-13

## 2018-01-13 MED FILL — POTASSIUM CHLORIDE 20 MEQ ORAL PACKET FOR SOLUTION: 20 mEq | ORAL | Qty: 2

## 2018-01-13 MED FILL — CHILDREN'S ASPIRIN 81 MG CHEWABLE TABLET: 81 mg | ORAL | Qty: 1

## 2018-01-13 MED FILL — POLYETHYLENE GLYCOL 3350 17 GRAM (100 %) ORAL POWDER PACKET: 17 gram | ORAL | Qty: 1

## 2018-01-13 MED FILL — SENNA-S 8.6 MG-50 MG TABLET: ORAL | Qty: 1

## 2018-01-13 MED FILL — ATORVASTATIN 40 MG TAB: 40 mg | ORAL | Qty: 2

## 2018-01-13 MED FILL — METOPROLOL SUCCINATE SR 25 MG 24 HR TAB: 25 mg | ORAL | Qty: 1

## 2018-01-13 NOTE — Progress Notes (Signed)
Progress Notes by Merdis Delay, MD at 01/13/18 1208                Author: Merdis Delay, MD  Service: Cardiology  Author Type: Physician       Filed: 01/13/18 1514  Date of Service: 01/13/18 1208  Status: Addendum          Editor: Merdis Delay, MD (Physician)          Related Notes: Original Note by Burgess Estelle (Physician Assistant) filed at 01/13/18  1214                         [facility]  -  Progress Note             Patient: Jennifer Holder  MRN: [id number]    SSN: [id number]           Date of Birth: [birth year]   Age: 84 y.o.    Sex: female         Admit Date: 01/11/2018   Patient seen and examined independently.  No chest pain or other complaints.  Medications switched to beta-blocker in light of patient's presenting symptoms.  Echo demonstrates some reduced systolic  function with a distal septal and possible apical wall hypokinesis.  Agree with assessment and plan as noted below.  Dondra Prader, MD     Assessment:        - Presented with fatigue, lethargy, diarrhea after taking Mg Citrate due to previous constipation. CT ABD pelvis with fecal impaction, no bowel obstruction.   - Elevated troponin 3.01 --> 3.04, has trended down to 0.99 this AM, no chest pain or acute EKG changes.     - Elevated WBC's   - Constipation, possibly aggravated by Verapamil.  Have switched from Verapamil to low-dose beta-blocker.        Plan:        -Continue current cardiac medication regimen to include ASA, Lipitor, Toprol.    -Pending discharge today per primary team.  Pt should follow-up with Dr. Duanne Moron in the next 2-3 weeks.        Subjective:        No new complaints.          Objective:         Patient Vitals for the past 8 hrs:            Temp  Pulse  Resp  BP  SpO2            01/13/18 1138  98.7 ??F (37.1 ??C)  68  18  131/81  98 %            01/13/18 0939  97.6 ??F (36.4 ??C)  85  16  111/58  96 %            01/13/18 0758  98.2 ??F (36.8 ??C)  84  18  122/78  (!) 86 %              Patient Vitals for the past 96 hrs:        Weight        01/12/18 1041  116 lb (52.6 kg)        01/11/18 0812  116 lb (52.6 kg)              Intake/Output Summary (Last 24 hours) at 01/13/2018 1208   Last data filed at 01/13/2018 0025     Gross per 24 hour  Intake  240 ml        Output  --        Net  240 ml           Physical Exam:   General:  alert, cooperative, no distress, appears stated age   Neck:  No JVD   Lungs:  clear to auscultation bilaterally to anterolateral lung fields   Heart:  Regular rate and rhythm   Abdomen:  abdomen is soft without significant tenderness, masses, organomegaly or guarding   Extremities:  Atraumatic, no edema       Data Review:          Labs:  Results:                  Chemistry  Recent Labs         01/13/18   0437  01/12/18   0340  01/11/18   0827      GLU  81  91  118*      NA  139  141  138      K  3.6  3.1*  3.8      CL  107  105  100      CO2  26  30  33*      BUN  13  13  17       CREA  0.55*  0.67  0.92      CA  8.3*  8.4*  9.2      MG   --    --   2.6      AGAP  6  6  5       BUCR  24*  19  18      AP   --    --   71      TP   --    --   7.4      ALB   --    --   4.0      GLOB   --    --   3.4      AGRAT   --    --   1.2              CBC w/Diff  Recent Labs         01/13/18   0437  01/12/18   1452  01/12/18   0340  01/11/18   0827      WBC  6.8   --   12.4  16.0*      RBC  3.12*   --   3.25*  3.93*      HGB  8.7*  9.1*  9.1*  11.2*      HCT  26.4*  27.9*  27.8*  33.6*      PLT  83*   --   90*  113*      GRANS   --    --    --   80*      LYMPH   --    --    --   16*      EOS   --    --    --   0           Cardiac Enzymes  Lab Results      Component  Value  Date/Time        TROIQ  0.99 (H)  01/13/2018 04:37 AM  Liver Enzymes  Recent Labs         01/11/18   0827      TP  7.4      ALB  4.0      AP  71      SGOT  37

## 2018-01-13 NOTE — Progress Notes (Signed)
Problem: Discharge Planning  Goal: *Discharge to safe environment  Outcome: Progressing Towards Goal          Home, possible home health     Pt to be discharged today.  She states her daughter will be picking her up. Pt qualifies for [facility] and is agreeable.  Orders and referral placed.    Care Management Interventions  PCP Verified by CM: Yes(Sue Whitehurst-Doss,NP, last seen about 1 year ago)  Palliative Care Criteria Met (RRAT>21 & CHF Dx)?: No  Mode of Transport at Discharge: Other (see comment)(family)  Transition of Care Consult (CM Consult): Discharge Planning  Discharge Durable Medical Equipment: No  Physical Therapy Consult: No  Occupational Therapy Consult: No  Speech Therapy Consult: No  Current Support Network: Lives Alone(in apartment over [place] of daughter's home)  Confirm Follow Up Transport: Self(daugher will be picking up)  Plan discussed with Pt/Family/Caregiver: Yes  Discharge Location  Discharge Placement: Other:(home with [facility])     [place] Provider list has been given to the patient and/or patient representative. Patient and/or patient representative has signed the Freedom of Choice selecting __[facility]_______________________as their preference agency and a copy given. Both Home Health Provider list and Freedom of Choice have been placed on the chart.

## 2018-01-13 NOTE — Progress Notes (Signed)
0730: Bedside shift change report given to Tobi RN (oncoming nurse) by Adelina MingsKelsey RN (offgoing nurse). Report included the following information SBAR, Kardex, Intake/Output, MAR, Accordion and Cardiac Rhythm SR with 1st Degree.     0940: Paged Ulyess BlossomLindsey Reprogle PA to clarify if she is wanting the patient to have both stool softner, and BP 111/59 with metoprolol. She stated okay to give all medications.     1330: Spoke with case management about patient needing a GYN consult after discharge. They stated that they are unable to set up an appointment but could give the name and number of the on call GYN. Spoke with daughter about if patient or her know of a GYN doctor. She stated that she does not, but that her mother's PCP does do female exams. Will Page Mardella LaymanLindsey PA about circumstances.     1356: Spoke with Mardella LaymanLindsey PA about what the daughter and case management had spoken with me. She stated that the patient should follow up with PCP especially if the patient PCP does vaginal exams.     1527: discharge Instructions given to patient and daughter at bedside. No complaints of pain or SOB.

## 2018-01-13 NOTE — Progress Notes (Signed)
Problem: Risk for Spread of Infection  Goal: Prevent transmission of infectious organism to others  Description  Prevent the transmission of infectious organisms to other patients, staff members, and visitors.  Outcome: Resolved/Met     Problem: Patient Education:  Go to Education Activity  Goal: Patient/Family Education  Outcome: Resolved/Met     Problem: Falls - Risk of  Goal: *Absence of Falls  Description  Document Schmid Fall Risk and appropriate interventions in the flowsheet.  Outcome: Resolved/Met     Problem: Patient Education: Go to Patient Education Activity  Goal: Patient/Family Education  Outcome: Resolved/Met     Problem: Pain  Goal: *Control of Pain  Outcome: Resolved/Met  Goal: *PALLIATIVE CARE:  Alleviation of Pain  Outcome: Resolved/Met     Problem: Patient Education: Go to Patient Education Activity  Goal: Patient/Family Education  Outcome: Resolved/Met     Problem: Patient Education: Go to Patient Education Activity  Goal: Patient/Family Education  Outcome: Resolved/Met     Problem: Unstable angina/NSTEMI: Day of Admission/Day 1  Goal: Off Pathway (Use only if patient is Off Pathway)  Outcome: Resolved/Met  Goal: Activity/Safety  Outcome: Resolved/Met  Goal: Consults, if ordered  Outcome: Resolved/Met  Goal: Diagnostic Test/Procedures  Outcome: Resolved/Met  Goal: Nutrition/Diet  Outcome: Resolved/Met  Goal: Discharge Planning  Outcome: Resolved/Met  Goal: Medications  Outcome: Resolved/Met  Goal: Respiratory  Outcome: Resolved/Met  Goal: Treatments/Interventions/Procedures  Outcome: Resolved/Met  Goal: Psychosocial  Outcome: Resolved/Met  Goal: *Hemodynamically stable  Outcome: Resolved/Met  Goal: *Optimal pain control at patient's stated goal  Outcome: Resolved/Met  Goal: *Lungs clear or at baseline  Outcome: Resolved/Met     Problem: Constipation - Risk of  Goal: *Prevention of constipation  Outcome: Resolved/Met  Goal: *PALLIATIVE CARE:  Prevention/Alleviation of constipation  Outcome:  Resolved/Met     Problem: Patient Education: Go to Patient Education Activity  Goal: Patient/Family Education  Outcome: Resolved/Met     Problem: Pressure Injury - Risk of  Goal: *Prevention of pressure injury  Description  Document Braden Scale and appropriate interventions in the flowsheet.  Outcome: Resolved/Met     Problem: Patient Education: Go to Patient Education Activity  Goal: Patient/Family Education  Outcome: Resolved/Met     Problem: Discharge Planning  Goal: *Discharge to safe environment  Outcome: Resolved/Met

## 2018-01-13 NOTE — Discharge Summary (Signed)
Discharge Summary by Jennifer Cluck, PA at 01/13/18 1452                Author: Roel Cluck, PA  Service: Physician Assistant  Author Type: Physician Assistant       Filed: 01/13/18 1500  Date of Service: 01/13/18 1452  Status: Attested           Editor: Jennifer Cluck, PA (Physician Assistant)  Cosigner: Jennifer Barre, MD at 01/13/18 1739          Attestation signed by Jennifer Barre, MD at 01/13/18 1739          I have discussed discharge management with PA Jennifer Holder and we have worked collaboratively to manage the patient's discharge.  I have examined the patient independently  and I agree with the Discharge Summary by PA Jennifer Holder                                          [facility]   Hospitalist Division      Discharge Summary          Patient: Jennifer Holder  MRN: [id number]   CSN: [id number]          Date of Birth: 1925-12-29   Age: 84 y.o.   Sex: female          DOA: 01/11/2018  LOS:  LOS: 2 days    Discharge Date:         Admission Diagnoses: NSTEMI (non-ST elevated myocardial infarction) [facility]) [I21.4]      Discharge Diagnoses:        Problem List as of 01/13/2018  Never Reviewed                        Codes  Class  Noted - Resolved             Vaginal bleeding  ICD-10-CM: N93.9   ICD-9-CM: 623.8    01/12/2018 - Present                       Hypokalemia  ICD-10-CM: E87.6   ICD-9-CM: 276.8    01/12/2018 - Present                       * (Principal) NSTEMI (non-ST elevated myocardial infarction) (HCC)  ICD-10-CM: I21.4   ICD-9-CM: 410.70    01/11/2018 - Present                       Constipation  ICD-10-CM: K59.00   ICD-9-CM: 564.00    01/11/2018 - Present                          Discharge Condition: Stable      Discharge To: Home      Consults: Cardiology      Hospital Course: Per H&P, "Jennifer Holder a 84 y.o.??female??who presented to the ED with c/o abd pain. Patient's  daughter states she took mag citrate yesterday and had a large, mostly formed stool last  night. She was concerned because the patient was still having abd pain today. In the ED, abd CT showed large stool burden in large intestine and possible fecal impaction,  no bowel obstruction. Patient on verapamil which may be contributing  to constipation.??Incidental finding on labs included troponin of 3.01. No changes on EKG. Cardiology consulted from ED. Patient will be admitted for further evaluation."   ??   Troponin trending down. Cardiology changed verapamil to BB. Patient has had multiple BMs and abdominal pain has resolved, but has developed vaginal bleeding. Therapeutic lovenox d/c'd. Pelvic organs unremarkable on initial CT. Patient will need GYN f/u  for likely endometrial bx. H&H remained stable overnight. VS and labs stable for discharge.         Physical Exam:   General appearance: alert, cooperative, no distress, appears stated age   Head: Normocephalic, without obvious abnormality, atraumatic   Lungs: clear to auscultation bilaterally   Heart: regular rate and rhythm, S1, S2 normal, no murmur, click, rub or gallop   Abdomen: soft, non-tender. Bowel sounds normal. No masses,  no organomegaly   Gyn: no active vaginal bleeding   Extremities: no cyanosis or edema   Skin: Skin color, texture normal. No rashes or lesions   Neurologic: no focal deficits, motor/sensory intact   PSY: Mood and affect normal, appropriately behaved      Significant Diagnostic Studies:       BMP:      Lab Results         Component  Value  Date/Time            NA  139  01/13/2018 04:37 AM       K  3.6  01/13/2018 04:37 AM       CL  107  01/13/2018 04:37 AM       CO2  26  01/13/2018 04:37 AM       AGAP  6  01/13/2018 04:37 AM       GLU  81  01/13/2018 04:37 AM       BUN  13  01/13/2018 04:37 AM       CREA  0.55 (L)  01/13/2018 04:37 AM       GFRAA  >60  01/13/2018 04:37 AM            GFRNA  >60  01/13/2018 04:37 AM        CBC:      Lab Results         Component  Value  Date/Time            WBC  6.8  01/13/2018 04:37 AM       HGB   8.7 (L)  01/13/2018 04:37 AM       HCT  26.4 (L)  01/13/2018 04:37 AM            PLT  83 (L)  01/13/2018 04:37 AM           Ct Abd Pelv W Cont      Result Date: 01/11/2018   EXAM: CT of the abdomen and pelvis INDICATION: Generalized weakness, diarrhea COMPARISON: Correlation is made to report of outside hospital CT 08/24/2016; however, these images are not available for review. TECHNIQUE: Axial CT imaging of the abdomen and  pelvis was performed with intravenous contrast. Multiplanar reformats were generated. One or more dose reduction techniques were used on this CT: automated exposure control, adjustment of the mAs and/or kVp according to patient size, and iterative reconstruction  techniques.  The specific techniques used on this CT exam have been documented in the patient's electronic medical record.  Digital Imaging and Communications in Medicine (DICOM) format image data are available to nonaffiliated external healthcare facilities  or entities  on a secure, media free, reciprocally searchable basis with patient authorization for at least a 44-month period after this study. _______________ FINDINGS: LOWER CHEST: Evaluation of the lung bases is mildly degraded by respiratory motion  artifact. No alveolar consolidation or pleural effusion. Cardiac size within normal limits. No pericardial effusion. LIVER, BILIARY: Hepatic parenchymal enhancement is uniform. There is both intrahepatic and extrahepatic biliary ductal dilatation present  in this patient status post cholecystectomy. Dilatation of the cystic duct remnant noted. Overall degree of CBD dilatation is estimated maximally at approximately 2.3 cm. Smooth tapering in the region of the pancreatic head is noted. No radiopaque choledocholithiasis.  PANCREAS: Normal pancreatic enhancement. No pancreatic ductal dilatation. No discrete pancreatic lesion present. SPLEEN: Small splenic hypodensity measuring approximately 1.0 cm in size, indeterminate but favored to  reflect a small cyst or hemangioma.  ADRENALS: Normal. KIDNEYS/URETERS/BLADDER: Symmetric renal enhancement. No hydronephrosis. No urolithiasis. Bilateral renal cysts. Urinary bladder is mildly distended. No bladder stone. PELVIC ORGANS: Unremarkable. VASCULATURE: Aortobiiliac atherosclerotic  calcification is present without evidence of aneurysmal dilatation. LYMPH NODES: No enlarged lymph nodes. GASTROINTESTINAL TRACT: Considerable burden of formed stool throughout the large intestine greatest in the rectum. No bowel junction. No free intraperitoneal  gas. No significant bowel wall thickening. BONES: No acute or aggressive osseous abnormalities identified. 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 OTHER: None. _______________       IMPRESSION: 1.  Marked burden of formed stool throughout the large intestine with imaging findings suggesting fecal impaction. 2. No bowel obstruction. 3. Intrahepatic and extrahepatic biliary ductal dilatation greater than anticipated for a postcholecystectomy  state. Correlation with liver function tests recommended, with MRCP or ERCP as appropriate clinically. Notably, similar findings were described on outside hospital abdominal/pelvic CT report 08/24/2016      Xr Chest Port      Result Date: 01/11/2018   EXAM: CHEST RADIOGRAPH, SINGLE VIEW CLINICAL INDICATION/HISTORY: Diarrhea and abdominal pain COMPARISON: None. TECHNIQUE: Portable frontal view of the chest was obtained. _______________ FINDINGS: SUPPORT DEVICES: None. HEART AND MEDIASTINUM: Heart and  pulmonary vascularity  are normal for AP technique. LUNGS AND PLEURAL SPACES: There is elevation of the left hemidiaphragm. Lungs are clear. No effusion or pneumothorax.  No pleural effusion or pneumothorax.  BONY THORAX AND SOFT TISSUES: No acute osseous  abnormality. _______________       IMPRESSION: No active cardiopulmonary disease.             Discharge Medications:        Current Discharge Medication List              START taking these medications          Details        metoprolol succinate (TOPROL-XL) 25 mg XL tablet  Take 0.5 Tabs by mouth daily.   Qty: 30 Tab, Refills:  0               polyethylene glycol (MIRALAX) 17 gram packet  Take 1 Packet by mouth daily.   Qty: 30 Packet, Refills:  0               senna-docusate (PERICOLACE) 8.6-50 mg per tablet  Take 1 Tab by mouth daily.   Qty: 30 Tab, Refills:  0                     CONTINUE these medications which have NOT CHANGED  Details        furosemide (LASIX) 40 mg tablet  Take  by mouth daily.          Associated Diagnoses: Recurrent UTI               methadone (DOLOPHINE) 10 mg tablet  Take  by mouth every four (4) hours as needed for Pain.          Associated Diagnoses: Recurrent UTI                     STOP taking these medications                  verapamil (CALAN) 120 mg tablet  Comments:    Reason for Stopping:                      nitrofurantoin (MACRODANTIN) 50 mg capsule  Comments:    Reason for Stopping:                             Activity: Activity as tolerated      Diet: Cardiac Diet      Wound Care: None needed      Follow-up: 3-5 days with PCP or GYN, 2-3 weeks with cardiology      Discharge time: >35 minutes      Ulyess BlossomLindsey Armin Yerger, PA-C   [facility]   Hospitalist Division   Office:  [phone]   Pager: [phone]         01/13/2018, 2:52 PM

## 2018-01-13 NOTE — Progress Notes (Signed)
Problem: Risk for Spread of Infection  Goal: Prevent transmission of infectious organism to others  Description  Prevent the transmission of infectious organisms to other patients, staff members, and visitors.  Outcome: Resolved/Met     Problem: Patient Education:  Go to Education Activity  Goal: Patient/Family Education  Outcome: Resolved/Met     Problem: Falls - Risk of  Goal: *Absence of Falls  Description  Document Schmid Fall Risk and appropriate interventions in the flowsheet.  Outcome: Resolved/Met     Problem: Patient Education: Go to Patient Education Activity  Goal: Patient/Family Education  Outcome: Resolved/Met     Problem: Pain  Goal: *Control of Pain  Outcome: Resolved/Met  Goal: *PALLIATIVE CARE:  Alleviation of Pain  Outcome: Resolved/Met     Problem: Patient Education: Go to Patient Education Activity  Goal: Patient/Family Education  Outcome: Resolved/Met     Problem: Patient Education: Go to Patient Education Activity  Goal: Patient/Family Education  Outcome: Resolved/Met     Problem: Unstable angina/NSTEMI: Day of Admission/Day 1  Goal: Off Pathway (Use only if patient is Off Pathway)  Outcome: Resolved/Met  Goal: Activity/Safety  Outcome: Resolved/Met  Goal: Consults, if ordered  Outcome: Resolved/Met  Goal: Diagnostic Test/Procedures  Outcome: Resolved/Met  Goal: Nutrition/Diet  Outcome: Resolved/Met  Goal: Discharge Planning  Outcome: Resolved/Met  Goal: Medications  Outcome: Resolved/Met  Goal: Respiratory  Outcome: Resolved/Met  Goal: Treatments/Interventions/Procedures  Outcome: Resolved/Met  Goal: Psychosocial  Outcome: Resolved/Met  Goal: *Hemodynamically stable  Outcome: Resolved/Met  Goal: *Optimal pain control at patient's stated goal  Outcome: Resolved/Met  Goal: *Lungs clear or at baseline  Outcome: Resolved/Met     Problem: Constipation - Risk of  Goal: *Prevention of constipation  Outcome: Resolved/Met  Goal: *PALLIATIVE CARE:  Prevention/Alleviation of constipation   Outcome: Resolved/Met     Problem: Patient Education: Go to Patient Education Activity  Goal: Patient/Family Education  Outcome: Resolved/Met     Problem: Pressure Injury - Risk of  Goal: *Prevention of pressure injury  Description  Document Braden Scale and appropriate interventions in the flowsheet.  Outcome: Resolved/Met     Problem: Patient Education: Go to Patient Education Activity  Goal: Patient/Family Education  Outcome: Resolved/Met     Problem: Discharge Planning  Goal: *Discharge to safe environment  Outcome: Resolved/Met

## 2018-01-13 NOTE — Progress Notes (Signed)
0730: Bedside shift change report given to Tobi RN (oncoming nurse) by Kelsey RN (offgoing nurse). Report included the following information SBAR, Kardex, Intake/Output, MAR, Accordion and Cardiac Rhythm SR with 1st Degree.     0940: Paged Lindsey Reprogle PA to clarify if she is wanting the patient to have both stool softner, and BP 111/59 with metoprolol. She stated okay to give all medications.     1330: Spoke with case management about patient needing a GYN consult after discharge. They stated that they are unable to set up an appointment but could give the name and number of the on call GYN. Spoke with daughter about if patient or her know of a GYN doctor. She stated that she does not, but that her mother's PCP does do female exams. Will Page Lindsey PA about circumstances.     1356: Spoke with Lindsey PA about what the daughter and case management had spoken with me. She stated that the patient should follow up with PCP especially if the patient PCP does vaginal exams.     1527: discharge Instructions given to patient and daughter at bedside. No complaints of pain or SOB.

## 2018-01-13 NOTE — Discharge Summary (Signed)
[facility]  Hospitalist Division  Discharge Summary  Patient: Jennifer Holder MRN: [id number]  CSN: [id number]    Date of Birth: 08-22-1925  Age: 84 y.o.  Sex: female    DOA: 01/11/2018 LOS:  LOS: 2 days   Discharge Date:      Admission Diagnoses: NSTEMI (non-ST elevated myocardial infarction) [facility]) [I21.4]    Discharge Diagnoses:    Problem List as of 01/13/2018 Never Reviewed          Codes Class Noted - Resolved    Vaginal bleeding ICD-10-CM: N93.9  ICD-9-CM: 623.8  01/12/2018 - Present        Hypokalemia ICD-10-CM: E87.6  ICD-9-CM: 276.8  01/12/2018 - Present        * (Principal) NSTEMI (non-ST elevated myocardial infarction) (HCC) ICD-10-CM: I21.4  ICD-9-CM: 410.70  01/11/2018 - Present        Constipation ICD-10-CM: K59.00  ICD-9-CM: 564.00  01/11/2018 - Present              Discharge Condition: Stable    Discharge To: Home    Consults: Cardiology    Hospital Course: Per H&P, "Jennifer Holder a 84 y.o.??female??who presented to the ED with c/o abd pain. Patient's daughter states she took mag citrate yesterday and had a large, mostly formed stool last night. She was concerned because the patient was still having abd pain today. In the ED, abd CT showed large stool burden in large intestine and possible fecal impaction, no bowel obstruction. Patient on verapamil which may be contributing to constipation.??Incidental finding on labs included troponin of 3.01. No changes on EKG. Cardiology consulted from ED. Patient will be admitted for further evaluation."  ??  Troponin trending down. Cardiology changed verapamil to BB. Patient has had multiple BMs and abdominal pain has resolved, but has developed vaginal bleeding. Therapeutic lovenox d/c'd. Pelvic organs unremarkable on initial CT. Patient will need GYN f/u for likely endometrial bx. H&H remained stable overnight. VS and labs stable for discharge.      Physical Exam:  General appearance: alert, cooperative, no distress, appears stated age   Head: Normocephalic, without obvious abnormality, atraumatic  Lungs: clear to auscultation bilaterally  Heart: regular rate and rhythm, S1, S2 normal, no murmur, click, rub or gallop  Abdomen: soft, non-tender. Bowel sounds normal. No masses,  no organomegaly  Gyn: no active vaginal bleeding  Extremities: no cyanosis or edema  Skin: Skin color, texture normal. No rashes or lesions  Neurologic: no focal deficits, motor/sensory intact  PSY: Mood and affect normal, appropriately behaved    Significant Diagnostic Studies:     BMP:   Lab Results   Component Value Date/Time    NA 139 01/13/2018 04:37 AM    K 3.6 01/13/2018 04:37 AM    CL 107 01/13/2018 04:37 AM    CO2 26 01/13/2018 04:37 AM    AGAP 6 01/13/2018 04:37 AM    GLU 81 01/13/2018 04:37 AM    BUN 13 01/13/2018 04:37 AM    CREA 0.55 (L) 01/13/2018 04:37 AM    GFRAA >60 01/13/2018 04:37 AM    GFRNA >60 01/13/2018 04:37 AM     CBC:   Lab Results   Component Value Date/Time    WBC 6.8 01/13/2018 04:37 AM    HGB 8.7 (L) 01/13/2018 04:37 AM    HCT 26.4 (L) 01/13/2018 04:37 AM    PLT 83 (L) 01/13/2018 04:37 AM       Ct Abd Pelv W Cont  Result Date: 01/11/2018  EXAM: CT of the abdomen and pelvis INDICATION: Generalized weakness, diarrhea COMPARISON: Correlation is made to report of outside hospital CT 08/24/2016; however, these images are not available for review. TECHNIQUE: Axial CT imaging of the abdomen and pelvis was performed with intravenous contrast. Multiplanar reformats were generated. One or more dose reduction techniques were used on this CT: automated exposure control, adjustment of the mAs and/or kVp according to patient size, and iterative reconstruction techniques.  The specific techniques used on this CT exam have been documented in the patient's electronic medical record.  Digital Imaging and Communications in Medicine (DICOM) format image data are available to nonaffiliated external healthcare facilities or entities on a secure,  media free, reciprocally searchable basis with patient authorization for at least a 6275-month period after this study. _______________ FINDINGS: LOWER CHEST: Evaluation of the lung bases is mildly degraded by respiratory motion artifact. No alveolar consolidation or pleural effusion. Cardiac size within normal limits. No pericardial effusion. LIVER, BILIARY: Hepatic parenchymal enhancement is uniform. There is both intrahepatic and extrahepatic biliary ductal dilatation present in this patient status post cholecystectomy. Dilatation of the cystic duct remnant noted. Overall degree of CBD dilatation is estimated maximally at approximately 2.3 cm. Smooth tapering in the region of the pancreatic head is noted. No radiopaque choledocholithiasis. PANCREAS: Normal pancreatic enhancement. No pancreatic ductal dilatation. No discrete pancreatic lesion present. SPLEEN: Small splenic hypodensity measuring approximately 1.0 cm in size, indeterminate but favored to reflect a small cyst or hemangioma. ADRENALS: Normal. KIDNEYS/URETERS/BLADDER: Symmetric renal enhancement. No hydronephrosis. No urolithiasis. Bilateral renal cysts. Urinary bladder is mildly distended. No bladder stone. PELVIC ORGANS: Unremarkable. VASCULATURE: Aortobiiliac atherosclerotic calcification is present without evidence of aneurysmal dilatation. LYMPH NODES: No enlarged lymph nodes. GASTROINTESTINAL TRACT: Considerable burden of formed stool throughout the large intestine greatest in the rectum. No bowel junction. No free intraperitoneal gas. No significant bowel wall thickening. BONES: No acute or aggressive osseous abnormalities identified. 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 OTHER: None. _______________     IMPRESSION: 1.  Marked burden of formed stool throughout the large intestine with imaging findings suggesting fecal impaction. 2. No bowel  obstruction. 3. Intrahepatic and extrahepatic biliary ductal dilatation greater than anticipated for a postcholecystectomy state. Correlation with liver function tests recommended, with MRCP or ERCP as appropriate clinically. Notably, similar findings were described on outside hospital abdominal/pelvic CT report 08/24/2016    Xr Chest Port    Result Date: 01/11/2018  EXAM: CHEST RADIOGRAPH, SINGLE VIEW CLINICAL INDICATION/HISTORY: Diarrhea and abdominal pain COMPARISON: None. TECHNIQUE: Portable frontal view of the chest was obtained. _______________ FINDINGS: SUPPORT DEVICES: None. HEART AND MEDIASTINUM: Heart and pulmonary vascularity  are normal for AP technique. LUNGS AND PLEURAL SPACES: There is elevation of the left hemidiaphragm. Lungs are clear. No effusion or pneumothorax.  No pleural effusion or pneumothorax.  BONY THORAX AND SOFT TISSUES: No acute osseous abnormality. _______________     IMPRESSION: No active cardiopulmonary disease.        Discharge Medications:     Current Discharge Medication List      START taking these medications    Details   metoprolol succinate (TOPROL-XL) 25 mg XL tablet Take 0.5 Tabs by mouth daily.  Qty: 30 Tab, Refills: 0      polyethylene glycol (MIRALAX) 17 gram packet Take 1 Packet by mouth daily.  Qty: 30 Packet, Refills: 0      senna-docusate (PERICOLACE) 8.6-50 mg  per tablet Take 1 Tab by mouth daily.  Qty: 30 Tab, Refills: 0         CONTINUE these medications which have NOT CHANGED    Details   furosemide (LASIX) 40 mg tablet Take  by mouth daily.    Associated Diagnoses: Recurrent UTI      methadone (DOLOPHINE) 10 mg tablet Take  by mouth every four (4) hours as needed for Pain.    Associated Diagnoses: Recurrent UTI         STOP taking these medications       verapamil (CALAN) 120 mg tablet Comments:   Reason for Stopping:         nitrofurantoin (MACRODANTIN) 50 mg capsule Comments:   Reason for Stopping:               Activity: Activity as tolerated     Diet: Cardiac Diet    Wound Care: None needed    Follow-up: 3-5 days with PCP or GYN, 2-3 weeks with cardiology    Discharge time: >35 minutes    Ulyess Blossom, PA-C  [facility]  Hospitalist Division  Office:  [phone]  Pager: [phone]      01/13/2018, 2:52 PM

## 2018-01-13 NOTE — Progress Notes (Addendum)
[facility]  -  Progress Note    Patient: Jennifer Holder MRN: [id number]  SSN: [id number]    Date of Birth: 1926-03-11  Age: 84 y.o.  Sex: female      Admit Date: 01/11/2018  Patient seen and examined independently.  No chest pain or other complaints.  Medications switched to beta-blocker in light of patient's presenting symptoms.  Echo demonstrates some reduced systolic function with a distal septal and possible apical wall hypokinesis.  Agree with assessment and plan as noted below.  Dondra Prader, MD  Assessment:     - Presented with fatigue, lethargy, diarrhea after taking Mg Citrate due to previous constipation. CT ABD pelvis with fecal impaction, no bowel obstruction.  - Elevated troponin 3.01 --> 3.04, has trended down to 0.99 this AM, no chest pain or acute EKG changes.    - Elevated WBC's  - Constipation, possibly aggravated by Verapamil.  Have switched from Verapamil to low-dose beta-blocker.    Plan:     -Continue current cardiac medication regimen to include ASA, Lipitor, Toprol.   -Pending discharge today per primary team.  Pt should follow-up with Dr. Duanne Moron in the next 2-3 weeks.    Subjective:     No new complaints.      Objective:      Patient Vitals for the past 8 hrs:   Temp Pulse Resp BP SpO2   01/13/18 1138 98.7 ??F (37.1 ??C) 68 18 131/81 98 %   01/13/18 0939 97.6 ??F (36.4 ??C) 85 16 111/58 96 %   01/13/18 0758 98.2 ??F (36.8 ??C) 84 18 122/78 (!) 86 %         Patient Vitals for the past 96 hrs:   Weight   01/12/18 1041 116 lb (52.6 kg)   01/11/18 0812 116 lb (52.6 kg)         Intake/Output Summary (Last 24 hours) at 01/13/2018 1208  Last data filed at 01/13/2018 0025  Gross per 24 hour   Intake 240 ml   Output ???   Net 240 ml       Physical Exam:  General:  alert, cooperative, no distress, appears stated age  Neck:  No JVD  Lungs:  clear to auscultation bilaterally to anterolateral lung fields  Heart:  Regular rate and rhythm   Abdomen:  abdomen is soft without significant tenderness, masses, organomegaly or guarding  Extremities:  Atraumatic, no edema     Data Review:     Labs: Results:       Chemistry Recent Labs     01/13/18  0437 01/12/18  0340 01/11/18  0827   GLU 81 91 118*   NA 139 141 138   K 3.6 3.1* 3.8   CL 107 105 100   CO2 26 30 33*   BUN 13 13 17    CREA 0.55* 0.67 0.92   CA 8.3* 8.4* 9.2   MG  --   --  2.6   AGAP 6 6 5    BUCR 24* 19 18   AP  --   --  71   TP  --   --  7.4   ALB  --   --  4.0   GLOB  --   --  3.4   AGRAT  --   --  1.2      CBC w/Diff Recent Labs     01/13/18  0437 01/12/18  1452 01/12/18  0340 01/11/18  0827   WBC 6.8  --  12.4 16.0*   RBC 3.12*  --  3.25* 3.93*   HGB 8.7* 9.1* 9.1* 11.2*   HCT 26.4* 27.9* 27.8* 33.6*   PLT 83*  --  90* 113*   GRANS  --   --   --  80*   LYMPH  --   --   --  16*   EOS  --   --   --  0      Cardiac Enzymes Lab Results   Component Value Date/Time    TROIQ 0.99 (H) 01/13/2018 04:37 AM      Liver Enzymes Recent Labs     01/11/18  0827   TP 7.4   ALB 4.0   AP 71   SGOT 37

## 2018-01-13 NOTE — Progress Notes (Signed)
Problem: Discharge Planning  Goal: *Discharge to safe environment  Outcome: Progressing Towards Goal          Home, possible home health     Pt to be discharged today.  She states her daughter will be picking her up. Pt qualifies for H2H and is agreeable.  Orders and referral placed.    Care Management Interventions  PCP Verified by CM: Yes(Sue Whitehurst-Doss,NP, last seen about 1 year ago)  Palliative Care Criteria Met (RRAT>21 & CHF Dx)?: No  Mode of Transport at Discharge: Other (see comment)(family)  Transition of Care Consult (CM Consult): Discharge Planning  Discharge Durable Medical Equipment: No  Physical Therapy Consult: No  Occupational Therapy Consult: No  Speech Therapy Consult: No  Current Support Network: Lives Alone(in apartment over Garage of daughter's home)  Confirm Follow Up Transport: Self(daugher will be picking up)  Plan discussed with Pt/Family/Caregiver: Yes  Discharge Location  Discharge Placement: Other:(home with H2H)     A Home Health Provider list has been given to the patient and/or patient representative. Patient and/or patient representative has signed the Freedom of Choice selecting __[facility]_______________________as their preference agency and a copy given. Both Home Health Provider list and Freedom of Choice have been placed on the chart.

## 2018-01-17 LAB — CULTURE, BLOOD
Culture result:: NO GROWTH
Culture result:: NO GROWTH

## 2018-01-17 LAB — CULTURE, BLOOD 1
Culture: NO GROWTH
Culture: NO GROWTH

## 2018-02-20 ENCOUNTER — Encounter: Attending: Cardiovascular Disease | Primary: Family

## 2020-04-02 ENCOUNTER — Ambulatory Visit
Admit: 2020-04-02 | Discharge: 2020-04-02 | Payer: MEDICARE | Attending: Student in an Organized Health Care Education/Training Program | Primary: Family

## 2020-04-02 ENCOUNTER — Ambulatory Visit: Attending: Student in an Organized Health Care Education/Training Program | Primary: Family

## 2020-04-02 DIAGNOSIS — R609 Edema, unspecified: Secondary | ICD-10-CM

## 2020-04-02 NOTE — Progress Notes (Signed)
Jennifer Holder  Chief Complaint   Patient presents with   ??? New Patient   ??? Swelling       History and Physical    Jennifer Holder is a 86 year old female who presents today with concern of right lower extremity edema and a nonhealing anterior shin wound.  Patient recently had a biopsy of the ulcer about a week ago and the ulcer has not healed since and pathology is pending.  Daughter notes that the right lower extremity is when the patient has not wearing compression stocking extremely edematous.  Patient is compliant with compression stockings and swelling has been under control.    Patient denies any signs of claudication rest pain or tissue loss.  Denies any sign of neurologic insufficiency.    Past Medical History:   Diagnosis Date   ??? Scoliosis      Patient Active Problem List   Diagnosis Code   ??? NSTEMI (non-ST elevated myocardial infarction) (HCC) I21.4   ??? Constipation K59.00   ??? Vaginal bleeding N93.9   ??? Hypokalemia E87.6     Past Surgical History:   Procedure Laterality Date   ??? HX CHOLECYSTECTOMY     ??? HX HERNIA REPAIR  2007     Current Outpatient Medications   Medication Sig Dispense Refill   ??? potassium chloride SR (Klor-Con 10) 10 mEq tablet Take  by mouth.     ??? metoprolol succinate (TOPROL-XL) 25 mg XL tablet Take 0.5 Tabs by mouth daily. 30 Tab 0   ??? polyethylene glycol (MIRALAX) 17 gram packet Take 1 Packet by mouth daily. 30 Packet 0   ??? senna-docusate (PERICOLACE) 8.6-50 mg per tablet Take 1 Tab by mouth daily. 30 Tab 0   ??? furosemide (LASIX) 40 mg tablet Take  by mouth daily.     ??? methadone (DOLOPHINE) 10 mg tablet Take  by mouth every eight (8) hours as needed for Pain.       No Known Allergies  Social History     Socioeconomic History   ??? Marital status: WIDOWED     Spouse name: Not on file   ??? Number of children: Not on file   ??? Years of education: Not on file   ??? Highest education level: Not on file   Occupational History   ??? Not on file   Tobacco Use   ??? Smoking status: Former Smoker      Years: 10.00   ??? Smokeless tobacco: Never Used   Substance and Sexual Activity   ??? Alcohol use: Yes     Comment: coasionally   ??? Drug use: No   ??? Sexual activity: Not on file   Other Topics Concern   ??? Not on file   Social History Narrative   ??? Not on file     Social Determinants of Health     Financial Resource Strain:    ??? Difficulty of Paying Living Expenses:    Food Insecurity:    ??? Worried About Programme researcher, broadcasting/film/video in the Last Year:    ??? Barista in the Last Year:    Transportation Needs:    ??? Freight forwarder (Medical):    ??? Lack of Transportation (Non-Medical):    Physical Activity:    ??? Days of Exercise per Week:    ??? Minutes of Exercise per Session:    Stress:    ??? Feeling of Stress :    Social Connections:    ??? Frequency  of Communication with Friends and Family:    ??? Frequency of Social Gatherings with Friends and Family:    ??? Attends Religious Services:    ??? Database administrator or Organizations:    ??? Attends Engineer, structural:    ??? Marital Status:    Intimate Programme researcher, broadcasting/film/video Violence:    ??? Fear of Current or Ex-Partner:    ??? Emotionally Abused:    ??? Physically Abused:    ??? Sexually Abused:       History reviewed. No pertinent family history.    Review of Systems    General: negative for fever   Eyes: negative for vision loss   HENT: negative for cold symptoms   Respiratory negative for shortness of breath   Cardiac: negative for chest pain   Vascular negative for foot pain at night    Gastrointestinal: negative for abdominal pain   Genitourinary: negative for dysuria    Endocrine: negative for excessive thirst   Skin: negative for rash   Neurological: negative for paralysis   Psychiatric: negative for depression        Physical Exam:    Visit Vitals  BP 112/82 (BP 1 Location: Left upper arm, BP Patient Position: Sitting)   Pulse 77   Resp 18   Ht 5\' 7"  (1.702 m)   Wt 116 lb (52.6 kg)   SpO2 95%   BMI 18.17 kg/m??        Constitutional:  Patient is well developed, well nourished, and not  distressed.   HEENT: atraumatic, normocephalic, wearing a mask.   Eyes:   Cunjunctivae clear, no scleral icterus  Neck:   No JVD present. There is no Carotid bruit.    Cardiovascular:  Normal rate, regular rhythm, normal heart sounds. No murmur heard.  Pulses:       Right:      Radial         2+    Dorsalis      2+    Post Tib      2+ Left:        Radial         2+    Dorsalis      2+    Post Tib      2+       Pulmonary/Chest: Effort normal and breath sounds normal.  she has no wheezes or no rales.   Abdominal:  Bowel sounds are present. Soft. No tenderness to palpation.   Extremities: Normal range of motion. No edema. Digits no cyanosis or clubbing  Neurological:  she  is alert and oriented x3 . Gait normal. Motor & sensory grossly intact in all 4 limbs.   Psych: Appropriate mood and affect.   Skin:  Skin is warm and dry. No rash noted. No erythema. No ulcers.     Impression and Plan:   86 y.o. female with right lower extremity edema.  Concern for superficial venous reflux.    I discussed with the patient and daughter that she likely has superficial venous reflux however she does not have any varicosities or any varicocele pain.  I am concerned that the ulcer is likely not healing because of venous insufficiency.  However the best treatment option at her age and given the location of the ulcer is compression therapy and leg elevation.  I will still get an venous reflux study to confirm diagnosis to aid in future treatment options.  Patient and daughter in agreement with the plan.  The treatment plan was reviewed with the patient in detail.  The patient voiced understanding of this plan and all questions and concerns were addressed.  The patient agrees with this plan.  We discussed the signs and symptoms that would require earlier attention or intervention.     I appreciate the opportunity to participate in the care of your patient.  I will be sure to keep you informed of any subsequent changes in the treatment  plan.  If you have any questions or concerns, please feel free to contact me.      Burgess Amor, MD

## 2020-04-07 ENCOUNTER — Ambulatory Visit: Payer: MEDICARE | Primary: Family

## 2020-05-02 ENCOUNTER — Encounter

## 2020-06-23 ENCOUNTER — Ambulatory Visit: Admit: 2020-06-23 | Discharge: 2020-06-23 | Payer: MEDICARE | Primary: Family

## 2020-06-25 LAB — DUPLEX LOWER EXT VENOUS RIGHT
Right CFV Rfx: 0.5 s
Right GSV BK Prox Diam: 0.27 cm
Right GSV BK Prox Rfx: 1.1 s
Right GSV Junc Diam: 0.79 cm
Right GSV Thigh Dist Diam: 0.44 cm
Right GSV Thigh Dist Rfx: 0.9 s
Right GSV Thigh Mid Diam: 0.32 cm
Right GSV Thigh Prox Diam: 0.4 cm
Right GSV Thight Mid Rfx: 0.7 s
Right Perforator Diam: 0.33 cm
Right Perforator Rfx: 1.7 s
Right SSV Prox Diam: 0.18 cm

## 2020-06-25 LAB — VAS DUP LOWER EXTREMITY VENOUS RIGHT
Right CFV Rfx: 0.5 s
Right GSV BK Prox Diam: 0.27 cm
Right GSV BK Prox Rfx: 1.1 s
Right GSV Junc Diam: 0.79 cm
Right GSV Thigh Dist Diam: 0.44 cm
Right GSV Thigh Dist Rfx: 0.9 s
Right GSV Thigh Mid Diam: 0.32 cm
Right GSV Thigh Prox Diam: 0.4 cm
Right GSV Thight Mid Rfx: 0.7 s
Right Perforator Diam: 0.33 cm
Right Perforator Rfx: 1.7 s
Right SSV Prox Diam: 0.18 cm

## 2020-06-30 ENCOUNTER — Ambulatory Visit: Admit: 2020-06-30 | Discharge: 2020-06-30 | Payer: MEDICARE | Attending: Surgical | Primary: Family

## 2020-06-30 ENCOUNTER — Ambulatory Visit: Attending: Surgical | Primary: Family

## 2020-06-30 DIAGNOSIS — I739 Peripheral vascular disease, unspecified: Secondary | ICD-10-CM

## 2020-06-30 NOTE — Progress Notes (Signed)
Jennifer Holder  Chief Complaint   Patient presents with   ??? Swelling       History and Physical    Jennifer Holder is a pleasant but anxious 86 year old female who presents today with concern of right lower extremity edema and a nonhealing anterior shin wound.  Patient recently had a biopsy of the ulcer about a  ago and the ulcer has not healed since and pathology is pending.  Patient returns for this visit to inform us that she has been feeling a little better but she is concerned about her ability to walk.  She is able to walk about 1-2 blocks without any leg pain but states that her back starts to bother her real bad and she has to stop her ambulation.  She states that since she was last seen the swelling that she was experiences is gotten little better with the use of compression elevation.  She denies any current ulcerations or skin rashes on this visit.  She states that she is concerned because her legs really bother her when she has to walk longer distances.  She denies any recent signs or symptoms of TIA or stroke on this visit.  She continues to take her daily meds as recommended including Lasix for her swelling.  She denies taking any aspirin or statin medication, was informed that she should be on it but refuses.  She continues to take methadone and states that she is on medical marijuana for pain management issues.  She has used CBD oil in the past which states helps but is too expensive.  Patient is compliant with compression stockings and swelling has been under control.    Patient denies any signs of claudication rest pain  on this visit but she is concerned because of a nonhealing wound on her right lower remedy    Past Medical History:   Diagnosis Date   ??? Scoliosis      Patient Active Problem List   Diagnosis Code   ??? NSTEMI (non-ST elevated myocardial infarction) (HCC) I21.4   ??? Constipation K59.00   ??? Vaginal bleeding N93.9   ??? Hypokalemia E87.6     Past Surgical History:   Procedure Laterality  Date   ??? HX CHOLECYSTECTOMY     ??? HX HERNIA REPAIR  2007     Current Outpatient Medications   Medication Sig Dispense Refill   ??? potassium chloride SR (Klor-Con 10) 10 mEq tablet Take  by mouth.     ??? metoprolol succinate (TOPROL-XL) 25 mg XL tablet Take 0.5 Tabs by mouth daily. 30 Tab 0   ??? polyethylene glycol (MIRALAX) 17 gram packet Take 1 Packet by mouth daily. 30 Packet 0   ??? senna-docusate (PERICOLACE) 8.6-50 mg per tablet Take 1 Tab by mouth daily. 30 Tab 0   ??? furosemide (LASIX) 40 mg tablet Take  by mouth daily.     ??? methadone (DOLOPHINE) 10 mg tablet Take  by mouth every eight (8) hours as needed for Pain.       No Known Allergies  Social History     Socioeconomic History   ??? Marital status: WIDOWED     Spouse name: Not on file   ??? Number of children: Not on file   ??? Years of education: Not on file   ??? Highest education level: Not on file   Occupational History   ??? Not on file   Tobacco Use   ??? Smoking status: Former Smoker  Years: 10.00   ??? Smokeless tobacco: Never Used   Substance and Sexual Activity   ??? Alcohol use: Yes     Comment: coasionally   ??? Drug use: No   ??? Sexual activity: Not on file   Other Topics Concern   ??? Not on file   Social History Narrative   ??? Not on file     Social Determinants of Health     Financial Resource Strain:    ??? Difficulty of Paying Living Expenses: Not on file   Food Insecurity:    ??? Worried About Running Out of Food in the Last Year: Not on file   ??? Ran Out of Food in the Last Year: Not on file   Transportation Needs:    ??? Lack of Transportation (Medical): Not on file   ??? Lack of Transportation (Non-Medical): Not on file   Physical Activity:    ??? Days of Exercise per Week: Not on file   ??? Minutes of Exercise per Session: Not on file   Stress:    ??? Feeling of Stress : Not on file   Social Connections:    ??? Frequency of Communication with Friends and Family: Not on file   ??? Frequency of Social Gatherings with Friends and Family: Not on file   ??? Attends Religious  Services: Not on file   ??? Active Member of Clubs or Organizations: Not on file   ??? Attends BankerClub or Organization Meetings: Not on file   ??? Marital Status: Not on file   Intimate Partner Violence:    ??? Fear of Current or Ex-Partner: Not on file   ??? Emotionally Abused: Not on file   ??? Physically Abused: Not on file   ??? Sexually Abused: Not on file   Housing Stability:    ??? Unable to Pay for Housing in the Last Year: Not on file   ??? Number of Places Lived in the Last Year: Not on file   ??? Unstable Housing in the Last Year: Not on file      History reviewed. No pertinent family history.    Review of Systems    General: negative for fever/chills.  Complains of chronic back pain.  Multiple other complaints in terms of arthritic pain and discomfort.  Patient takes Motrin for relief.  Patient is ambulating 1-2 blocks without any leg pain or discomfort.  Reports pain with longer distance ambulation due to chronic back problems.   Eyes: negative for vision loss   HENT: negative for cold symptoms   Respiratory negative for shortness of breath   Cardiac: negative for chest pain   Vascular negative for foot pain at night    Gastrointestinal: negative for abdominal pain   Genitourinary: negative for dysuria    Endocrine: negative for excessive thirst   Skin:  Negative for new onset of ulceration skin rashes or skin lesions.  Still with a right shin lesion which is slow healing.   Neurological: negative for paralysis   Psychiatric: negative for depression        Physical Exam:    Visit Vitals  BP 110/74 (BP 1 Location: Left upper arm, BP Patient Position: Sitting)   Resp 17   Ht 5\' 7"  (1.702 m)   Wt 116 lb (52.6 kg)   BMI 18.17 kg/m??        Constitutional:  Patient is well developed, well nourished, and not distressed.  Ambulated into the room albeit slowly but with a steady gait.  Unfortunately smells of cigarette smoke on this visit.  HEENT: atraumatic, normocephalic, wearing a mask.   Eyes:   Cunjunctivae clear, no scleral  icterus  Neck:   No JVD present. There is no Carotid bruit or thrill appreciated on this visit.  Cardiovascular:  Normal rate, regular rhythm, normal heart sounds. No murmur heard.  Pulses: Palpable pedal pulses       Right:      Radial         2+    Dorsalis      2+    Post Tib      2+ Left:        Radial         2+    Dorsalis      2+    Post Tib      2+       Pulmonary/Chest: Effort normal and breath sounds normal.  she has no wheezes or no rales.   Abdominal:  Bowel sounds are present. Soft. No tenderness to palpation.   Extremities: Normal range of motion. No edema. Digits no cyanosis or clubbing  Neurological:  she  is alert and oriented x3 . Gait normal. Motor & sensory grossly intact in all 4 limbs.   Psych: Appropriate mood and affect.   Skin:  Skin is warm and dry. No rash noted. No erythema. No ulcers.     Impression and Plan:   86 y.o. female with right lower extremity edema.  Concern for superficial venous reflux.  Continues to use compression elevation, but denies elevating her legs above the level of her heart.  Long discussion with patient about utilizing compression elevation in the face of venous insufficiency.  Patient educated on proper elevation.  Educated on elevation above the level of her heart.  Patient instructed she must lie flat with 2-3 pillows under her legs to get her legs 2030 inches above the level of her heart for proper elevation.  Also encouraged to be compliant with the usage of compression stockings.    I discussed with the patient and family that she likely has superficial venous reflux however she does not have any varicosities or any varicocele pain.  I am concerned that the ulcer is likely not healing because of venous insufficiency.  However the best treatment option at her age and given the location of the ulcer is compression therapy and leg elevation.  I will still get an venous reflux study to confirm diagnosis to aid in future treatment options.  Patient and daughter  in agreement with the plan.    Daughter also educated on signs and symptoms of claudication and acute limb ischemia.  Given that the patient has palpable pedal pulses we will continue with observation and surveillance.      Follow-up in 6 months with repeat bilateral lower extremity ABI/PVR imaging, and will also obtain bilateral carotid duplex imaging at that visit.    The treatment plan was reviewed with the patient in detail.  The patient voiced understanding of this plan and all questions and concerns were addressed.  The patient agrees with this plan.  We discussed the signs and symptoms that would require earlier attention or intervention.     Discussion with patient about smoking cessation.  Patient instructed that even with marijuana it still a carcinogen in can have effect on her vasculature.  Patient encouraged to continue with smoking cessation.  Patient offered both pharmacological and psychological assistance but declines and states she will quit  on her own.    I appreciate the opportunity to participate in the care of your patient.  I will be sure to keep you informed of any subsequent changes in the treatment plan.  If you have any questions or concerns, please feel free to contact me.    As stated above follow-up in 6 months with repeat imaging.  Patient encouraged to call sooner if she has any questions or issues.  Or if she is to experience any increasing symptoms of claudication.  Patient states she understands more than willing to proceed as planned.  We will discuss details with my attending.    Marveen Reeks Forrest Jaroszewski, PA-C

## 2020-12-16 ENCOUNTER — Ambulatory Visit: Payer: MEDICARE | Primary: Family

## 2020-12-23 ENCOUNTER — Encounter: Payer: MEDICARE | Attending: Surgical | Primary: Family

## 2020-12-29 ENCOUNTER — Encounter

## 2021-01-23 ENCOUNTER — Encounter: Attending: Surgical | Primary: Family

## 2021-08-12 DEATH — deceased
